# Patient Record
Sex: Female | Born: 1995 | Race: White | Hispanic: No | Marital: Single | Smoking: Current every day smoker
Health system: Southern US, Community
[De-identification: ages and names within clinical notes are randomized; demographics above are authoritative.]

## PROBLEM LIST (undated history)

## (undated) ENCOUNTER — Inpatient Hospital Stay (HOSPITAL_COMMUNITY): Payer: Self-pay

## (undated) DIAGNOSIS — D649 Anemia, unspecified: Secondary | ICD-10-CM

## (undated) DIAGNOSIS — N028 Recurrent and persistent hematuria with other morphologic changes: Secondary | ICD-10-CM

## (undated) DIAGNOSIS — N02B9 Other recurrent and persistent immunoglobulin A nephropathy: Secondary | ICD-10-CM

## (undated) DIAGNOSIS — N83209 Unspecified ovarian cyst, unspecified side: Secondary | ICD-10-CM

## (undated) DIAGNOSIS — L720 Epidermal cyst: Secondary | ICD-10-CM

## (undated) DIAGNOSIS — N289 Disorder of kidney and ureter, unspecified: Secondary | ICD-10-CM

## (undated) DIAGNOSIS — I10 Essential (primary) hypertension: Secondary | ICD-10-CM

## (undated) DIAGNOSIS — J45909 Unspecified asthma, uncomplicated: Secondary | ICD-10-CM

## (undated) DIAGNOSIS — B999 Unspecified infectious disease: Secondary | ICD-10-CM

## (undated) HISTORY — PX: APPENDECTOMY: SHX54

## (undated) HISTORY — DX: Epidermal cyst: L72.0

## (undated) HISTORY — DX: Unspecified asthma, uncomplicated: J45.909

## (undated) HISTORY — DX: Anemia, unspecified: D64.9

## (undated) HISTORY — PX: TONSILLECTOMY: SUR1361

## (undated) HISTORY — PX: WISDOM TOOTH EXTRACTION: SHX21

---
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
## 2015-01-05 ENCOUNTER — Encounter: Payer: Self-pay | Admitting: *Deleted

## 2015-01-05 ENCOUNTER — Emergency Department
Admission: EM | Admit: 2015-01-05 | Discharge: 2015-01-05 | Disposition: A | Discharge status: Home / Self Care | Payer: Medicaid Other | Attending: Emergency Medicine | Admitting: Emergency Medicine

## 2015-01-05 DIAGNOSIS — I10 Essential (primary) hypertension: Secondary | ICD-10-CM | POA: Insufficient documentation

## 2015-01-05 DIAGNOSIS — Z792 Long term (current) use of antibiotics: Secondary | ICD-10-CM | POA: Diagnosis not present

## 2015-01-05 DIAGNOSIS — Z79899 Other long term (current) drug therapy: Secondary | ICD-10-CM | POA: Diagnosis not present

## 2015-01-05 DIAGNOSIS — Z720 Tobacco use: Secondary | ICD-10-CM | POA: Insufficient documentation

## 2015-01-05 DIAGNOSIS — R102 Pelvic and perineal pain: Secondary | ICD-10-CM | POA: Diagnosis present

## 2015-01-05 DIAGNOSIS — Z3202 Encounter for pregnancy test, result negative: Secondary | ICD-10-CM | POA: Diagnosis not present

## 2015-01-05 DIAGNOSIS — N8320 Unspecified ovarian cysts: Secondary | ICD-10-CM | POA: Insufficient documentation

## 2015-01-05 DIAGNOSIS — N83201 Unspecified ovarian cyst, right side: Secondary | ICD-10-CM

## 2015-01-05 LAB — COMPREHENSIVE METABOLIC PANEL
ALK PHOS: 77 U/L (ref 38–126)
ALT: 15 U/L (ref 14–54)
AST: 19 U/L (ref 15–41)
Albumin: 3.5 g/dL (ref 3.5–5.0)
Anion gap: 7 (ref 5–15)
BUN: 9 mg/dL (ref 6–20)
CALCIUM: 8.6 mg/dL — AB (ref 8.9–10.3)
CO2: 25 mmol/L (ref 22–32)
Chloride: 106 mmol/L (ref 101–111)
Creatinine, Ser: 0.61 mg/dL (ref 0.44–1.00)
Glucose, Bld: 107 mg/dL — ABNORMAL HIGH (ref 65–99)
Potassium: 3.3 mmol/L — ABNORMAL LOW (ref 3.5–5.1)
Sodium: 138 mmol/L (ref 135–145)
Total Bilirubin: 0.3 mg/dL (ref 0.3–1.2)
Total Protein: 6.6 g/dL (ref 6.5–8.1)

## 2015-01-05 LAB — URINALYSIS COMPLETE WITH MICROSCOPIC (ARMC ONLY)
BILIRUBIN URINE: NEGATIVE
Bacteria, UA: NONE SEEN
GLUCOSE, UA: NEGATIVE mg/dL
Hgb urine dipstick: NEGATIVE
KETONES UR: NEGATIVE mg/dL
Leukocytes, UA: NEGATIVE
Nitrite: NEGATIVE
PH: 7 (ref 5.0–8.0)
Protein, ur: >500 mg/dL — AB
Specific Gravity, Urine: 1.015 (ref 1.005–1.030)

## 2015-01-05 LAB — CBC
HCT: 39 % (ref 35.0–47.0)
Hemoglobin: 12.6 g/dL (ref 12.0–16.0)
MCH: 24.2 pg — AB (ref 26.0–34.0)
MCHC: 32.4 g/dL (ref 32.0–36.0)
MCV: 74.7 fL — AB (ref 80.0–100.0)
PLATELETS: 290 10*3/uL (ref 150–440)
RBC: 5.22 MIL/uL — AB (ref 3.80–5.20)
RDW: 16 % — ABNORMAL HIGH (ref 11.5–14.5)
WBC: 12.3 10*3/uL — ABNORMAL HIGH (ref 3.6–11.0)

## 2015-01-05 LAB — LIPASE, BLOOD: Lipase: 20 U/L — ABNORMAL LOW (ref 22–51)

## 2015-01-05 LAB — POCT PREGNANCY, URINE: Preg Test, Ur: NEGATIVE

## 2015-01-05 NOTE — ED Provider Notes (Signed)
[name] Emergency Department Provider Note  ____________________________________________  Time seen: 1917  I have reviewed the triage vital signs and the nursing notes.   HISTORY  Chief Complaint Abdominal Pain     HPI Carly Taylor is a 19 y.o. female who presents to the emergency from today with pain in her right lower abdomen and pelvis. She reports this is very similar to the pain she had on August 7 when she was seen here before. The pain has waxed and waned over the past few weeks. This morning she felt comfortable. This started to worsen this afternoon. She took a 200 mg tablet of ibuprofen and lay down. She was able to rest and the pain went away. After she woke the pain began to come back and she decided to come to the emergency department.  When she was seen here on August 7, she was told she had ovarian cyst. An ultrasound had been performed. She is referred to gynecology. She has not made a follow-up appointment with them.    Past Medical History  Diagnosis Date  . IgA nephropathy   . Hypertension   . Kidney disease     There are no active problems to display for this patient.   Past Surgical History  Procedure Laterality Date  . Appendectomy      Current Outpatient Rx  Name  Route  Sig  Dispense  Refill  . cephALEXin (KEFLEX) 500 MG capsule   Oral   Take 1 capsule (500 mg total) by mouth 2 (two) times daily.   20 capsule   0   . HYDROcodone-acetaminophen (NORCO) 5-325 MG per tablet   Oral   Take 1 tablet by mouth every 4 (four) hours as needed for moderate pain.   12 tablet   0   . lisinopril (PRINIVIL,ZESTRIL) 20 MG tablet   Oral   Take 20 mg by mouth daily.         Marland Kitchen oxyCODONE-acetaminophen (ROXICET) 5-325 MG per tablet   Oral   Take 1 tablet by mouth every 6 (six) hours as needed.   12 tablet   0     Allergies Nsaids  No family history on file.  Social History Social History  Substance Use Topics   . Smoking status: Current Every Day Smoker  . Smokeless tobacco: Never Used  . Alcohol Use: No    Review of Systems  Constitutional: Negative for fever. ENT: Negative for sore throat. Cardiovascular: Negative for chest pain. Respiratory: Negative for shortness of breath. Gastrointestinal: Negative for abdominal pain, vomiting and diarrhea. Genitourinary: Positive for pelvic pain, similar to earlier this month. She history of present illness Musculoskeletal: No myalgias or injuries. Skin: Negative for rash. Neurological: Negative for headaches   10-point ROS otherwise negative.  ____________________________________________   PHYSICAL EXAM:  VITAL SIGNS: ED Triage Vitals  Enc Vitals Group     BP 01/05/15 1739 155/105 mmHg     Pulse Rate 01/05/15 1739 87     Resp 01/05/15 1739 16     Temp 01/05/15 1739 98.3 F (36.8 C)     Temp Source 01/05/15 1739 Oral     SpO2 01/05/15 1739 99 %     Weight 01/05/15 1739 198 lb (89.812 kg)     Height 01/05/15 1739 5\' 4"  (1.626 m)     Head Cir --      Peak Flow --      Pain Score 01/05/15 1740 7  Pain Loc --      Pain Edu? --      Excl. in [place]? --     Constitutional:  Alert and oriented. Well appearing and in no distress. ENT   Head: Normocephalic and atraumatic.   Nose: No congestion/rhinnorhea.   Mouth/Throat: Mucous membranes are moist. Cardiovascular: Normal rate, regular rhythm, no murmur noted Respiratory:  Normal respiratory effort, no tachypnea.    Breath sounds are clear and equal bilaterally.  Gastrointestinal: Soft with minimal tenderness in the right pelvic area. No distention.  Back: No muscle spasm, no tenderness, no CVA tenderness. Musculoskeletal: No deformity noted. Nontender with normal range of motion in all extremities.  No noted edema. Neurologic:  Normal speech and language. No gross focal neurologic deficits are appreciated.  Skin:  Skin is warm, dry. No rash noted. Psychiatric: Mood and  affect are normal. Speech and behavior are normal.  ____________________________________________    LABS (pertinent positives/negatives)  Labs Reviewed  LIPASE, BLOOD - Abnormal; Notable for the following:    Lipase 20 (*)    All other components within normal limits  COMPREHENSIVE METABOLIC PANEL - Abnormal; Notable for the following:    Potassium 3.3 (*)    Glucose, Bld 107 (*)    Calcium 8.6 (*)    All other components within normal limits  CBC - Abnormal; Notable for the following:    WBC 12.3 (*)    RBC 5.22 (*)    MCV 74.7 (*)    MCH 24.2 (*)    RDW 16.0 (*)    All other components within normal limits  URINALYSIS COMPLETEWITH MICROSCOPIC ([facility] ONLY) - Abnormal; Notable for the following:    Color, Urine YELLOW (*)    APPearance CLEAR (*)    Protein, ur >500 (*)    Squamous Epithelial / LPF 0-5 (*)    All other components within normal limits  POC URINE PREG, ED  POCT PREGNANCY, URINE     ____________________________________________    RADIOLOGY  Ultrasound from August 7 reviewed.  ____________________________________________    INITIAL IMPRESSION / ASSESSMENT AND PLAN / ED COURSE  Pertinent labs & imaging results that were available during my care of the patient were reviewed by me and considered in my medical decision making (see chart for details).   19 year old female with right pelvic pain similar to what she had earlier this month when she was diagnosed with ovarian cyst. That cyst had a daughter says within it as well. This pain appears to be fairly easily controlled, as she took 200 mg of ibuprofen and lay down and her pain went away. She currently is comfortable and in no acute distress.  We have reviewed her lab results with her as well as the ultrasound from August 7. I counseled her to follow-up with gynecology again. She reports that she will do this.  I do not believe any stronger pain medications are indicated, as her pain is fairly well  controlled with the use of ibuprofen and lying down.  ____________________________________________   FINAL CLINICAL IMPRESSION(S) / ED DIAGNOSES  Final diagnoses:  Pelvic pain in female  Right ovarian cyst       Ahmed Prima, MD 01/05/15 [phone]

## 2015-01-05 NOTE — ED Notes (Signed)
Pt reports right sided abdominal pain, back pain, nausea, and vaginal spotting. Pt also has had frequent urination. She states she was here for the same, dx with ovarian cyst, feels similar.

## 2015-01-05 NOTE — Discharge Instructions (Signed)
Your blood test is okay. Your urine was normal. Urine pregnancy test was negative. We reviewed the ultrasound you had on August 7. Your pain has improved. He may continue to take ibuprofen, but she may need a higher dose. Take 600 mg 3 times a day if needed. Follow-up with one of the doctors at [place]. Return to the emergency department if you have worsening pain or if you have other urgent concerns.  Ovarian Cyst An ovarian cyst is a sac filled with fluid or blood. This sac is attached to the ovary. Some cysts go away on their own. Other cysts need treatment.  HOME CARE   Only take medicine as told by your doctor.  Follow up with your doctor as told.  Get regular pelvic exams and Pap tests. GET HELP IF:  Your periods are late, not regular, or painful.  You stop having periods.  Your belly (abdominal) or pelvic pain does not go away.  Your belly becomes large or puffy (swollen).  You have a hard time peeing (totally emptying your bladder).  You have pressure on your bladder.  You have pain during sex.  You feel fullness, pressure, or discomfort in your belly.  You lose weight for no reason.  You feel sick most of the time.  You have a hard time pooping (constipation).  You do not feel like eating.  You develop pimples (acne).  You have an increase in hair on your body and face.  You are gaining weight for no reason.  You think you are pregnant. GET HELP RIGHT AWAY IF:   Your belly pain gets worse.  You feel sick to your stomach (nauseous), and you throw up (vomit).  You have a fever that comes on fast.  You have belly pain while pooping (bowel movement).  Your periods are heavier than usual. MAKE SURE YOU:   Understand these instructions.  Will watch your condition.  Will get help right away if you are not doing well or get worse. Document Released: 10/09/2007 Document Revised: 02/10/2013 Document Reviewed: 12/28/2012 [facility] Patient  Information 2015 [place], [place]. This information is not intended to replace advice given to you by your health care provider. Make sure you discuss any questions you have with your health care provider.

## 2015-01-21 ENCOUNTER — Encounter: Payer: Self-pay | Admitting: Emergency Medicine

## 2015-01-21 ENCOUNTER — Emergency Department
Admission: EM | Admit: 2015-01-21 | Discharge: 2015-01-21 | Discharge status: Against Medical Advice | Payer: Medicaid Other | Attending: Emergency Medicine | Admitting: Emergency Medicine

## 2015-01-21 DIAGNOSIS — Z720 Tobacco use: Secondary | ICD-10-CM | POA: Insufficient documentation

## 2015-01-21 DIAGNOSIS — R51 Headache: Secondary | ICD-10-CM | POA: Insufficient documentation

## 2015-01-21 DIAGNOSIS — I10 Essential (primary) hypertension: Secondary | ICD-10-CM | POA: Diagnosis not present

## 2015-01-21 DIAGNOSIS — R110 Nausea: Secondary | ICD-10-CM | POA: Diagnosis not present

## 2015-01-21 LAB — COMPREHENSIVE METABOLIC PANEL
ALBUMIN: 3.4 g/dL — AB (ref 3.5–5.0)
ALT: 13 U/L — ABNORMAL LOW (ref 14–54)
ANION GAP: 5 (ref 5–15)
AST: 19 U/L (ref 15–41)
Alkaline Phosphatase: 82 U/L (ref 38–126)
BUN: 8 mg/dL (ref 6–20)
CALCIUM: 9 mg/dL (ref 8.9–10.3)
CO2: 26 mmol/L (ref 22–32)
Chloride: 110 mmol/L (ref 101–111)
Creatinine, Ser: 0.64 mg/dL (ref 0.44–1.00)
GFR calc non Af Amer: >60 mL/min (ref >60)
GLUCOSE: 105 mg/dL — AB (ref 65–99)
POTASSIUM: 3.2 mmol/L — AB (ref 3.5–5.1)
SODIUM: 141 mmol/L (ref 135–145)
Total Bilirubin: 0.5 mg/dL (ref 0.3–1.2)
Total Protein: 6.5 g/dL (ref 6.5–8.1)

## 2015-01-21 LAB — CBC WITH DIFFERENTIAL/PLATELET
BASOS PCT: 1 %
Basophils Absolute: 0.1 10*3/uL (ref 0–0.1)
EOS ABS: 0.2 10*3/uL (ref 0–0.7)
EOS PCT: 2 %
HCT: 37.8 % (ref 35.0–47.0)
Hemoglobin: 12.3 g/dL (ref 12.0–16.0)
LYMPHS ABS: 2.1 10*3/uL (ref 1.0–3.6)
Lymphocytes Relative: 20 %
MCH: 24.6 pg — AB (ref 26.0–34.0)
MCHC: 32.6 g/dL (ref 32.0–36.0)
MCV: 75.4 fL — ABNORMAL LOW (ref 80.0–100.0)
MONO ABS: 0.4 10*3/uL (ref 0.2–0.9)
MONOS PCT: 4 %
Neutro Abs: 7.9 10*3/uL — ABNORMAL HIGH (ref 1.4–6.5)
Neutrophils Relative %: 73 %
PLATELETS: 296 10*3/uL (ref 150–440)
RBC: 5.02 MIL/uL (ref 3.80–5.20)
RDW: 16.9 % — AB (ref 11.5–14.5)
WBC: 10.7 10*3/uL (ref 3.6–11.0)

## 2015-01-21 LAB — URINALYSIS COMPLETE WITH MICROSCOPIC (ARMC ONLY)
BILIRUBIN URINE: NEGATIVE
GLUCOSE, UA: NEGATIVE mg/dL
Ketones, ur: NEGATIVE mg/dL
Nitrite: NEGATIVE
PH: 6 (ref 5.0–8.0)
Protein, ur: >500 mg/dL — AB
SPECIFIC GRAVITY, URINE: 1.014 (ref 1.005–1.030)

## 2015-01-21 LAB — POCT PREGNANCY, URINE: Preg Test, Ur: NEGATIVE

## 2015-01-21 NOTE — ED Notes (Signed)
Denies head injury or fevers

## 2015-01-21 NOTE — ED Notes (Signed)
Called x 3 for room, not in waiting room.

## 2015-06-15 ENCOUNTER — Emergency Department
Admission: EM | Admit: 2015-06-15 | Discharge: 2015-06-15 | Disposition: A | Discharge status: Home / Self Care | Payer: Medicaid Other | Attending: Emergency Medicine | Admitting: Emergency Medicine

## 2015-06-15 ENCOUNTER — Encounter: Payer: Self-pay | Admitting: *Deleted

## 2015-06-15 ENCOUNTER — Emergency Department: Payer: Medicaid Other

## 2015-06-15 DIAGNOSIS — R079 Chest pain, unspecified: Secondary | ICD-10-CM

## 2015-06-15 DIAGNOSIS — I10 Essential (primary) hypertension: Secondary | ICD-10-CM | POA: Insufficient documentation

## 2015-06-15 DIAGNOSIS — Z3202 Encounter for pregnancy test, result negative: Secondary | ICD-10-CM | POA: Insufficient documentation

## 2015-06-15 DIAGNOSIS — F17210 Nicotine dependence, cigarettes, uncomplicated: Secondary | ICD-10-CM | POA: Insufficient documentation

## 2015-06-15 LAB — CBC
HCT: 47.5 % — ABNORMAL HIGH (ref 35.0–47.0)
Hemoglobin: 15.9 g/dL (ref 12.0–16.0)
MCH: 26.9 pg (ref 26.0–34.0)
MCHC: 33.4 g/dL (ref 32.0–36.0)
MCV: 80.6 fL (ref 80.0–100.0)
Platelets: 336 10*3/uL (ref 150–440)
RBC: 5.9 MIL/uL — ABNORMAL HIGH (ref 3.80–5.20)
RDW: 15.7 % — ABNORMAL HIGH (ref 11.5–14.5)
WBC: 15.4 10*3/uL — ABNORMAL HIGH (ref 3.6–11.0)

## 2015-06-15 LAB — URINALYSIS COMPLETE WITH MICROSCOPIC (ARMC ONLY)
Bacteria, UA: NONE SEEN
Bilirubin Urine: NEGATIVE
GLUCOSE, UA: NEGATIVE mg/dL
Ketones, ur: NEGATIVE mg/dL
Leukocytes, UA: NEGATIVE
NITRITE: NEGATIVE
Protein, ur: >500 mg/dL — AB
SPECIFIC GRAVITY, URINE: 1.014 (ref 1.005–1.030)
pH: 5 (ref 5.0–8.0)

## 2015-06-15 LAB — BASIC METABOLIC PANEL
Anion gap: 10 (ref 5–15)
BUN: 9 mg/dL (ref 6–20)
CALCIUM: 9.4 mg/dL (ref 8.9–10.3)
CO2: 23 mmol/L (ref 22–32)
CREATININE: 0.83 mg/dL (ref 0.44–1.00)
Chloride: 106 mmol/L (ref 101–111)
GFR calc Af Amer: >60 mL/min (ref >60)
GLUCOSE: 107 mg/dL — AB (ref 65–99)
Potassium: 3.5 mmol/L (ref 3.5–5.1)
Sodium: 139 mmol/L (ref 135–145)

## 2015-06-15 LAB — POCT PREGNANCY, URINE: PREG TEST UR: NEGATIVE

## 2015-06-15 NOTE — ED Provider Notes (Signed)
[facility] Emergency Department Provider Note     Time seen: ----------------------------------------- 6:21 PM on 06/15/2015 -----------------------------------------    I have reviewed the triage vital signs and the nursing notes.   HISTORY  Chief Complaint Chest Pain    HPI Carly Taylor is a 20 y.o. female who presents to ER for left-sided chest pain for last 2 days. 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 Patient states earlier her blood pressure was elevated but currently it is not. She denies any current complaints.   Past Medical History  Diagnosis Date  . IgA nephropathy   . Hypertension   . Kidney disease   . IgA nephropathy     There are no active problems to display for this patient.   Past Surgical History  Procedure Laterality Date  . Appendectomy      Allergies Nsaids  Social History Social History  Substance Use Topics  . Smoking status: Current Every Day Smoker -- 0.50 packs/day    Types: Cigarettes  . Smokeless tobacco: Never Used  . Alcohol Use: No    Review of Systems Constitutional: Negative for fever. Eyes: Negative for visual changes. ENT: Negative for sore throat. Cardiovascular: Positive for chest pain  Respiratory: Negative for shortness of breath. Gastrointestinal: Negative for abdominal pain, vomiting and diarrhea. Genitourinary: Negative for dysuria. Musculoskeletal: Negative for back pain. Skin: Negative for rash. Neurological: Negative for headaches, focal weakness or numbness.  10-point ROS otherwise negative.  ____________________________________________   PHYSICAL EXAM:  VITAL SIGNS: ED Triage Vitals  Enc Vitals Group     BP 06/15/15 1815 128/79 mmHg     Pulse Rate 06/15/15 1815 113    Resp 06/15/15 1815 18     Temp 06/15/15 1815 98.7 F (37.1 C)     Temp Source 06/15/15 1815 Oral     SpO2 06/15/15 1815 98 %     Weight 06/15/15 1815 201 lb (91.173 kg)     Height 06/15/15 1815 5\' 4"  (1.626 m)     Head Cir --      Peak Flow --      Pain Score 06/15/15 1811 0     Pain Loc --      Pain Edu? --      Excl. in [place]? --     Constitutional: Alert and oriented. Well appearing and in no distress. Eyes: Conjunctivae are normal. PERRL. Normal extraocular movements. ENT   Head: Normocephalic and atraumatic.   Nose: No congestion/rhinnorhea.   Mouth/Throat: Mucous membranes are moist.   Neck: No stridor. Cardiovascular: Rapid rate, regular rhythm. Normal and symmetric distal pulses are present in all extremities. No murmurs, rubs, or gallops. Respiratory: Normal respiratory effort without tachypnea nor retractions. Breath sounds are clear and equal bilaterally. No wheezes/rales/rhonchi. Gastrointestinal: Soft and nontender. No distention. No abdominal bruits.  Musculoskeletal: Nontender with normal range of motion in all extremities. No joint effusions.  No lower extremity tenderness nor edema. Neurologic:  Normal speech and language. No gross focal neurologic deficits are appreciated. Speech is normal. No gait instability. Skin:  Skin is warm, dry and intact. No rash noted. Psychiatric: Mood and affect are normal. Speech and behavior are normal. Patient exhibits appropriate insight and judgment. ____________________________________________  EKG: Interpreted by me. Sinus tachycardia with a rate  of 107 bpm, normal PR interval and QRS, normal QT interval. Normal axis. T waves.  ____________________________________________  ED COURSE:  Pertinent labs & imaging results that were available during my care of the patient were reviewed by me and considered in my medical decision making (see chart for details). Patient is in no acute distress, will check basic labs and  reevaluate. ____________________________________________    LABS (pertinent positives/negatives)  Labs Reviewed  BASIC METABOLIC PANEL - Abnormal; Notable for the following:    Glucose, Bld 107 (*)    All other components within normal limits  CBC - Abnormal; Notable for the following:    WBC 15.4 (*)    RBC 5.90 (*)    HCT 47.5 (*)    RDW 15.7 (*)    All other components within normal limits  URINALYSIS COMPLETEWITH MICROSCOPIC ([facility] ONLY) - Abnormal; Notable for the following:    Color, Urine YELLOW (*)    APPearance HAZY (*)    Hgb urine dipstick 1+ (*)    Protein, ur >500 (*)    Squamous Epithelial / LPF 0-5 (*)    All other components within normal limits  POC URINE PREG, ED  POCT PREGNANCY, URINE    RADIOLOGY  Chest x-ray IMPRESSION: No active cardiopulmonary disease. ____________________________________________  FINAL ASSESSMENT AND PLAN  Chest pain  Plan: Patient with labs and imaging as dictated above. Nonspecific chest pain, does not appear to be any acute process. She does have chronic proteinuria. Is advised to follow-up with her doctor for reevaluation.   Earleen Newport, MD   Earleen Newport, MD 06/15/15 [phone]

## 2015-06-15 NOTE — ED Notes (Signed)
Pt reports left sided chest pain x2 days; reports she went to donate plasma today and they told her she was tachycardic (130).

## 2015-06-15 NOTE — Discharge Instructions (Signed)

## 2015-09-20 ENCOUNTER — Emergency Department
Admission: EM | Admit: 2015-09-20 | Discharge: 2015-09-20 | Disposition: A | Discharge status: Home / Self Care | Payer: Self-pay | Attending: Emergency Medicine | Admitting: Emergency Medicine

## 2015-09-20 ENCOUNTER — Encounter: Payer: Self-pay | Admitting: Emergency Medicine

## 2015-09-20 DIAGNOSIS — R809 Proteinuria, unspecified: Secondary | ICD-10-CM | POA: Insufficient documentation

## 2015-09-20 DIAGNOSIS — R109 Unspecified abdominal pain: Secondary | ICD-10-CM

## 2015-09-20 DIAGNOSIS — R197 Diarrhea, unspecified: Secondary | ICD-10-CM | POA: Insufficient documentation

## 2015-09-20 DIAGNOSIS — I10 Essential (primary) hypertension: Secondary | ICD-10-CM | POA: Insufficient documentation

## 2015-09-20 DIAGNOSIS — Z79899 Other long term (current) drug therapy: Secondary | ICD-10-CM | POA: Insufficient documentation

## 2015-09-20 DIAGNOSIS — R112 Nausea with vomiting, unspecified: Secondary | ICD-10-CM | POA: Insufficient documentation

## 2015-09-20 DIAGNOSIS — F17210 Nicotine dependence, cigarettes, uncomplicated: Secondary | ICD-10-CM | POA: Insufficient documentation

## 2015-09-20 LAB — URINALYSIS COMPLETE WITH MICROSCOPIC (ARMC ONLY)
BILIRUBIN URINE: NEGATIVE
Glucose, UA: NEGATIVE mg/dL
Ketones, ur: NEGATIVE mg/dL
LEUKOCYTES UA: NEGATIVE
Nitrite: NEGATIVE
PH: 5 (ref 5.0–8.0)
Specific Gravity, Urine: 1.014 (ref 1.005–1.030)

## 2015-09-20 LAB — CBC
HEMATOCRIT: 41.8 % (ref 35.0–47.0)
Hemoglobin: 14.4 g/dL (ref 12.0–16.0)
MCH: 27.5 pg (ref 26.0–34.0)
MCHC: 34.5 g/dL (ref 32.0–36.0)
MCV: 79.7 fL — AB (ref 80.0–100.0)
PLATELETS: 324 10*3/uL (ref 150–440)
RBC: 5.25 MIL/uL — AB (ref 3.80–5.20)
RDW: 15.3 % — ABNORMAL HIGH (ref 11.5–14.5)
WBC: 13 10*3/uL — ABNORMAL HIGH (ref 3.6–11.0)

## 2015-09-20 LAB — COMPREHENSIVE METABOLIC PANEL
ALT: 14 U/L (ref 14–54)
AST: 19 U/L (ref 15–41)
Albumin: 3.5 g/dL (ref 3.5–5.0)
Alkaline Phosphatase: 90 U/L (ref 38–126)
Anion gap: 7 (ref 5–15)
BUN: 10 mg/dL (ref 6–20)
CHLORIDE: 109 mmol/L (ref 101–111)
CO2: 23 mmol/L (ref 22–32)
CREATININE: 0.76 mg/dL (ref 0.44–1.00)
Calcium: 9.3 mg/dL (ref 8.9–10.3)
GFR calc non Af Amer: >60 mL/min (ref >60)
Glucose, Bld: 109 mg/dL — ABNORMAL HIGH (ref 65–99)
POTASSIUM: 3.9 mmol/L (ref 3.5–5.1)
SODIUM: 139 mmol/L (ref 135–145)
Total Bilirubin: 0.3 mg/dL (ref 0.3–1.2)
Total Protein: 6.4 g/dL — ABNORMAL LOW (ref 6.5–8.1)

## 2015-09-20 LAB — LIPASE, BLOOD: LIPASE: 21 U/L (ref 11–51)

## 2015-09-20 MED ORDER — ONDANSETRON 4 MG PO TBDP: 4.0000 mg | ORAL_TABLET | Freq: Three times a day (TID) | ORAL | Status: DC | PRN

## 2015-09-20 MED ORDER — LOPERAMIDE HCL 2 MG PO TABS: 2.0000 mg | ORAL_TABLET | Freq: Four times a day (QID) | ORAL | Status: DC | PRN

## 2015-09-20 MED ORDER — DIPHENOXYLATE-ATROPINE 2.5-0.025 MG PO TABS
1.0000 | ORAL_TABLET | Freq: Once | ORAL | Status: AC
  Administered 2015-09-20: 1 via ORAL
  Filled 2015-09-20: qty 1

## 2015-09-20 NOTE — ED Provider Notes (Signed)
[facility] Emergency Department Provider Note  ____________________________________________  Time seen: Approximately 5:52 PM  I have reviewed the triage vital signs and the nursing notes.   HISTORY  Chief Complaint Abdominal Pain    HPI Carly Taylor is a 20 y.o. female with known IgA nephropathy and associated hypertension and proteinuria, who is a Hotel manager, presenting with nausea vomiting and diarrhea. The patient reports that for the last several weeks she's been having multiple daily episodes of watery nonbloody stool. Last week she had 1 episode of vomiting, and then had another episode today. She estimates that today alone she had 8 episodes of watery stool. She denies any camping, travel outside the [place] states, recent antibiotic use, or known food allergies. She's not had fever, chills, lightheadedness or syncope. She is not having any urinary symptoms. She has not tried any anti-diarrheal medication due to concerns about her kidney dysfunction. She occasionally has associated lower abdominal cramping which occurs just prior to diarrhea or during the time she is having a bowel movement, but never occurs outside of these times.LMP is unknown and she has Norplant   Past Medical History  Diagnosis Date  . IgA nephropathy   . Hypertension   . Kidney disease   . IgA nephropathy     There are no active problems to display for this patient.   Past Surgical History  Procedure Laterality Date  . Appendectomy      Current Outpatient Rx  Name  Route  Sig  Dispense  Refill  . cephALEXin (KEFLEX) 500 MG capsule   Oral   Take 1 capsule (500 mg total) by mouth 2 (two) times daily.   20 capsule   0   . HYDROcodone-acetaminophen (NORCO) 5-325 MG per tablet   Oral   Take 1 tablet by mouth every 4 (four) hours as needed for moderate pain.   12 tablet   0   . lisinopril (PRINIVIL,ZESTRIL) 20 MG tablet   Oral   Take 20 mg by mouth daily.          Marland Kitchen loperamide (IMODIUM A-D) 2 MG tablet   Oral   Take 1 tablet (2 mg total) by mouth 4 (four) times daily as needed for diarrhea or loose stools.   12 tablet   0   . ondansetron (ZOFRAN ODT) 4 MG disintegrating tablet   Oral   Take 1 tablet (4 mg total) by mouth every 8 (eight) hours as needed for nausea or vomiting.   10 tablet   0   . oxyCODONE-acetaminophen (ROXICET) 5-325 MG per tablet   Oral   Take 1 tablet by mouth every 6 (six) hours as needed.   12 tablet   0     Allergies Nsaids  No family history on file.  Social History Social History  Substance Use Topics  . Smoking status: Current Every Day Smoker -- 0.50 packs/day    Types: Cigarettes  . Smokeless tobacco: Never Used  . Alcohol Use: No    Review of Systems Constitutional: No fever/chills.No lightheadedness or syncope. Eyes: No visual changes. ENT: No sore throat. No congestion or rhinorrhea. Cardiovascular: Denies chest pain. Denies palpitations. Respiratory: Denies shortness of breath.  No cough. Gastrointestinal: Positive lower abdominal cramping.  Positive nausea, positive vomiting.  Positive diarrhea.  No constipation. Genitourinary: Negative for dysuria. Musculoskeletal: Negative for back pain. Skin: Negative for rash. Neurological: Negative for headaches. No focal numbness, tingling or weakness.   10-point ROS otherwise negative.  ____________________________________________   PHYSICAL EXAM:  VITAL SIGNS: ED Triage Vitals  Enc Vitals Group     BP 09/20/15 1512 165/112 mmHg     Pulse Rate 09/20/15 1512 94     Resp 09/20/15 1512 20     Temp 09/20/15 1512 98.6 F (37 C)     Temp Source 09/20/15 1512 Oral     SpO2 09/20/15 1512 100 %     Weight 09/20/15 1512 210 lb (95.255 kg)     Height 09/20/15 1512 5\' 4"  (1.626 m)     Head Cir --      Peak Flow --      Pain Score 09/20/15 1513 6     Pain Loc --      Pain Edu? --      Excl. in [place]? --     Constitutional: Alert and  oriented. Well appearing and in no acute distress. Answers questions appropriately. Eyes: Conjunctivae are normal.  EOMI. No scleral icterus. Head: Atraumatic. Nose: No congestion/rhinnorhea. Mouth/Throat: Mucous membranes are moist.  Neck: No stridor.  Supple.   Cardiovascular: Normal rate, regular rhythm. No murmurs, rubs or gallops.  Respiratory: Normal respiratory effort.  No accessory muscle use or retractions. Lungs CTAB.  No wheezes, rales or ronchi. Gastrointestinal: Obese. Soft, nontender and nondistended.  No guarding or rebound.  No peritoneal signs. Musculoskeletal: No LE edema. No ttp in the calves or palpable cords.  Negative Homan's sign. Neurologic:  A&Ox3.  Speech is clear.  Face and smile are symmetric.  EOMI.  Moves all extremities well. Skin:  Skin is warm, dry and intact. No rash noted. No jaundice. Psychiatric: Mood and affect are normal. Speech and behavior are normal.  Normal judgement.  ____________________________________________   LABS (all labs ordered are listed, but only abnormal results are displayed)  Labs Reviewed  COMPREHENSIVE METABOLIC PANEL - Abnormal; Notable for the following:    Glucose, Bld 109 (*)    Total Protein 6.4 (*)    All other components within normal limits  CBC - Abnormal; Notable for the following:    WBC 13.0 (*)    RBC 5.25 (*)    MCV 79.7 (*)    RDW 15.3 (*)    All other components within normal limits  URINALYSIS COMPLETEWITH MICROSCOPIC ([facility] ONLY) - Abnormal; Notable for the following:    Color, Urine YELLOW (*)    APPearance CLEAR (*)    Hgb urine dipstick 1+ (*)    Protein, ur >500 (*)    Bacteria, UA RARE (*)    Squamous Epithelial / LPF 0-5 (*)    All other components within normal limits  LIPASE, BLOOD   ____________________________________________  EKG  Not indicated ____________________________________________  RADIOLOGY  No results  found.  ____________________________________________   PROCEDURES  Procedure(s) performed: None  Critical Care performed: No ____________________________________________   INITIAL IMPRESSION / ASSESSMENT AND PLAN / ED COURSE  Pertinent labs & imaging results that were available during my care of the patient were reviewed by me and considered in my medical decision making (see chart for details).  20 y.o. female with a history of IgA nephropathy presenting with several weeks of daily watery stool and diarrhea with 2 discrete episodes of nausea and vomiting. Overall, the patient is well-appearing. She is hypertensive on exam, but states that this is baseline. She is afebrile. There is no acute findings in her abdomen which would be concerning for acute surgical pathology. 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 She may also have an undiagnosed food sensitivity or food allergy. I will plan to treat her symptomatically, and have her follow-up with her primary care physician. Her labs are reassuring today and imaging is not indicated given her reassuring abdominal examination. She does have a mildly elevated white blood cell count, but this appears to be within the realm of her normal baseline. The patient has proteinuria and is already aware of this.  ____________________________________________  FINAL CLINICAL IMPRESSION(S) / ED DIAGNOSES  Final diagnoses:  Proteinuria  Essential hypertension  Nausea vomiting and diarrhea  Abdominal cramping      NEW MEDICATIONS STARTED DURING THIS VISIT:  New Prescriptions   LOPERAMIDE (IMODIUM A-D) 2 MG TABLET    Take 1 tablet (2 mg total) by mouth 4 (four) times daily as needed for diarrhea or loose stools.   ONDANSETRON (ZOFRAN ODT) 4 MG DISINTEGRATING TABLET    Take 1 tablet (4 mg total) by mouth every 8 (eight) hours as needed for nausea or vomiting.     Eula Listen, MD 09/20/15  [id number]

## 2015-09-20 NOTE — Discharge Instructions (Signed)
If you're able to produce a stool sample, please bring it with you to your follow-up with your primary care physician so that it can be tested. Please drink plenty of fluid to stay well-hydrated. Please take a BRAT diet for the next 3-4 days, then advance your diet as tolerated. You may take Zofran for nausea and loperamide for diarrhea. Please practice frequent and good handwashing to prevent the spread of infection.  Return to the emergency department if you develop lightheadedness or fainting, inability to keep down fluids, fever, abdominal pain, especially if it stays in one area, or any other symptoms concerning to you.

## 2015-09-20 NOTE — ED Notes (Signed)
Pt presents with abd pain for three mth and began vomiting today.

## 2015-09-24 ENCOUNTER — Encounter (HOSPITAL_COMMUNITY): Payer: Self-pay | Admitting: Emergency Medicine

## 2015-09-24 DIAGNOSIS — R197 Diarrhea, unspecified: Secondary | ICD-10-CM | POA: Insufficient documentation

## 2015-09-24 DIAGNOSIS — R319 Hematuria, unspecified: Secondary | ICD-10-CM | POA: Insufficient documentation

## 2015-09-24 DIAGNOSIS — F17210 Nicotine dependence, cigarettes, uncomplicated: Secondary | ICD-10-CM | POA: Insufficient documentation

## 2015-09-24 DIAGNOSIS — R1084 Generalized abdominal pain: Secondary | ICD-10-CM | POA: Insufficient documentation

## 2015-09-24 DIAGNOSIS — R300 Dysuria: Secondary | ICD-10-CM | POA: Insufficient documentation

## 2015-09-24 DIAGNOSIS — I10 Essential (primary) hypertension: Secondary | ICD-10-CM | POA: Insufficient documentation

## 2015-09-24 LAB — COMPREHENSIVE METABOLIC PANEL WITH GFR
ALT: 16 U/L (ref 14–54)
AST: 16 U/L (ref 15–41)
Albumin: 3 g/dL — ABNORMAL LOW (ref 3.5–5.0)
Alkaline Phosphatase: 95 U/L (ref 38–126)
Anion gap: 8 (ref 5–15)
BUN: 15 mg/dL (ref 6–20)
CO2: 23 mmol/L (ref 22–32)
Calcium: 8.4 mg/dL — ABNORMAL LOW (ref 8.9–10.3)
Chloride: 107 mmol/L (ref 101–111)
Creatinine, Ser: 0.71 mg/dL (ref 0.44–1.00)
GFR calc Af Amer: >60 mL/min
GFR calc non Af Amer: >60 mL/min
Glucose, Bld: 111 mg/dL — ABNORMAL HIGH (ref 65–99)
Potassium: 3.4 mmol/L — ABNORMAL LOW (ref 3.5–5.1)
Sodium: 138 mmol/L (ref 135–145)
Total Bilirubin: 0.2 mg/dL — ABNORMAL LOW (ref 0.3–1.2)
Total Protein: 6.3 g/dL — ABNORMAL LOW (ref 6.5–8.1)

## 2015-09-24 LAB — URINALYSIS, ROUTINE W REFLEX MICROSCOPIC
Bilirubin Urine: NEGATIVE
GLUCOSE, UA: NEGATIVE mg/dL
Ketones, ur: NEGATIVE mg/dL
Nitrite: POSITIVE — AB
Protein, ur: >300 mg/dL — AB
SPECIFIC GRAVITY, URINE: 1.022 (ref 1.005–1.030)
pH: 6.5 (ref 5.0–8.0)

## 2015-09-24 LAB — POC URINE PREG, ED: Preg Test, Ur: NEGATIVE

## 2015-09-24 LAB — CBC
HCT: 42.8 % (ref 36.0–46.0)
HEMOGLOBIN: 14.6 g/dL (ref 12.0–15.0)
MCH: 27.5 pg (ref 26.0–34.0)
MCHC: 34.1 g/dL (ref 30.0–36.0)
MCV: 80.6 fL (ref 78.0–100.0)
PLATELETS: 337 10*3/uL (ref 150–400)
RBC: 5.31 MIL/uL — AB (ref 3.87–5.11)
RDW: 14.3 % (ref 11.5–15.5)
WBC: 13.7 10*3/uL — ABNORMAL HIGH (ref 4.0–10.5)

## 2015-09-24 LAB — URINE MICROSCOPIC-ADD ON

## 2015-09-24 LAB — LIPASE, BLOOD: LIPASE: 25 U/L (ref 11–51)

## 2015-09-24 NOTE — ED Notes (Signed)
Pt. reports hematuria , dysuria , generalized abdominal pain and diarrhea onset 2 months ago , denies fever or chills.

## 2015-09-25 ENCOUNTER — Emergency Department (HOSPITAL_COMMUNITY)
Admission: EM | Admit: 2015-09-25 | Discharge: 2015-09-25 | Disposition: A | Discharge status: Home / Self Care | Payer: Medicaid Other | Attending: Emergency Medicine | Admitting: Emergency Medicine

## 2015-09-25 NOTE — ED Notes (Signed)
Pt called for B17 in waiting room, no answer.

## 2015-10-17 ENCOUNTER — Encounter (HOSPITAL_COMMUNITY): Payer: Self-pay

## 2015-10-17 ENCOUNTER — Emergency Department (HOSPITAL_COMMUNITY)
Admission: EM | Admit: 2015-10-17 | Discharge: 2015-10-17 | Disposition: A | Discharge status: Home / Self Care | Payer: Medicaid Other | Attending: Emergency Medicine | Admitting: Emergency Medicine

## 2015-10-17 DIAGNOSIS — Z5321 Procedure and treatment not carried out due to patient leaving prior to being seen by health care provider: Secondary | ICD-10-CM | POA: Insufficient documentation

## 2015-10-17 DIAGNOSIS — R1030 Lower abdominal pain, unspecified: Secondary | ICD-10-CM | POA: Insufficient documentation

## 2015-10-17 LAB — I-STAT BETA HCG BLOOD, ED (MC, WL, AP ONLY): I-stat hCG, quantitative: <5 m[IU]/mL (ref <5)

## 2015-10-17 LAB — CBC
HEMATOCRIT: 41.9 % (ref 36.0–46.0)
HEMOGLOBIN: 14.1 g/dL (ref 12.0–15.0)
MCH: 27.4 pg (ref 26.0–34.0)
MCHC: 33.7 g/dL (ref 30.0–36.0)
MCV: 81.4 fL (ref 78.0–100.0)
Platelets: 298 10*3/uL (ref 150–400)
RBC: 5.15 MIL/uL — ABNORMAL HIGH (ref 3.87–5.11)
RDW: 14.2 % (ref 11.5–15.5)
WBC: 11 10*3/uL — ABNORMAL HIGH (ref 4.0–10.5)

## 2015-10-17 LAB — URINALYSIS, ROUTINE W REFLEX MICROSCOPIC
BILIRUBIN URINE: NEGATIVE
GLUCOSE, UA: NEGATIVE mg/dL
Ketones, ur: NEGATIVE mg/dL
Leukocytes, UA: NEGATIVE
Nitrite: NEGATIVE
PH: 6 (ref 5.0–8.0)
Protein, ur: >300 mg/dL — AB
SPECIFIC GRAVITY, URINE: 1.023 (ref 1.005–1.030)

## 2015-10-17 LAB — COMPREHENSIVE METABOLIC PANEL
ALBUMIN: 3.1 g/dL — AB (ref 3.5–5.0)
ALK PHOS: 85 U/L (ref 38–126)
ALT: 18 U/L (ref 14–54)
ANION GAP: 7 (ref 5–15)
AST: 20 U/L (ref 15–41)
BUN: 8 mg/dL (ref 6–20)
CALCIUM: 9.3 mg/dL (ref 8.9–10.3)
CHLORIDE: 106 mmol/L (ref 101–111)
CO2: 23 mmol/L (ref 22–32)
Creatinine, Ser: 0.95 mg/dL (ref 0.44–1.00)
GFR calc Af Amer: >60 mL/min (ref >60)
GFR calc non Af Amer: >60 mL/min (ref >60)
GLUCOSE: 99 mg/dL (ref 65–99)
Potassium: 3.7 mmol/L (ref 3.5–5.1)
SODIUM: 136 mmol/L (ref 135–145)
Total Bilirubin: 0.3 mg/dL (ref 0.3–1.2)
Total Protein: 6 g/dL — ABNORMAL LOW (ref 6.5–8.1)

## 2015-10-17 LAB — URINE MICROSCOPIC-ADD ON: WBC, UA: NONE SEEN WBC/hpf (ref 0–5)

## 2015-10-17 LAB — LIPASE, BLOOD: Lipase: 21 U/L (ref 11–51)

## 2015-10-17 MED ORDER — OXYCODONE-ACETAMINOPHEN 5-325 MG PO TABS: 1.0000 | ORAL_TABLET | Freq: Once | ORAL | Status: DC

## 2015-10-17 NOTE — ED Notes (Signed)
Pt was noted to be standing outside, this RN went and told patient they were cleaning a room for her, pt stated she would be inside in a minute, unable to locate patient at this time, attempted to call patient with no answer at this time. Will continue to look for her.

## 2015-10-17 NOTE — ED Notes (Signed)
Unable to locate patient x3.

## 2015-10-17 NOTE — ED Notes (Signed)
Patient complains of lower abdominal pain x 3 daya with nausea, history of ovarian cyst and thinks its related to same

## 2015-10-26 ENCOUNTER — Encounter (HOSPITAL_COMMUNITY): Payer: Self-pay | Admitting: Nurse Practitioner

## 2015-10-26 ENCOUNTER — Emergency Department (HOSPITAL_COMMUNITY): Payer: Medicaid Other

## 2015-10-26 ENCOUNTER — Emergency Department (HOSPITAL_COMMUNITY)
Admission: EM | Admit: 2015-10-26 | Discharge: 2015-10-27 | Disposition: A | Discharge status: Home / Self Care | Payer: Medicaid Other | Attending: Emergency Medicine | Admitting: Emergency Medicine

## 2015-10-26 DIAGNOSIS — F17210 Nicotine dependence, cigarettes, uncomplicated: Secondary | ICD-10-CM | POA: Insufficient documentation

## 2015-10-26 DIAGNOSIS — B349 Viral infection, unspecified: Secondary | ICD-10-CM

## 2015-10-26 DIAGNOSIS — I10 Essential (primary) hypertension: Secondary | ICD-10-CM | POA: Insufficient documentation

## 2015-10-26 LAB — COMPREHENSIVE METABOLIC PANEL
ALK PHOS: 83 U/L (ref 38–126)
ALT: 18 U/L (ref 14–54)
ANION GAP: 7 (ref 5–15)
AST: 19 U/L (ref 15–41)
Albumin: 3.4 g/dL — ABNORMAL LOW (ref 3.5–5.0)
BILIRUBIN TOTAL: 0.5 mg/dL (ref 0.3–1.2)
BUN: 9 mg/dL (ref 6–20)
CALCIUM: 8.1 mg/dL — AB (ref 8.9–10.3)
CO2: 23 mmol/L (ref 22–32)
Chloride: 99 mmol/L — ABNORMAL LOW (ref 101–111)
Creatinine, Ser: 0.69 mg/dL (ref 0.44–1.00)
GFR calc non Af Amer: >60 mL/min (ref >60)
Glucose, Bld: 110 mg/dL — ABNORMAL HIGH (ref 65–99)
POTASSIUM: 3.4 mmol/L — AB (ref 3.5–5.1)
Sodium: 129 mmol/L — ABNORMAL LOW (ref 135–145)
TOTAL PROTEIN: 6.8 g/dL (ref 6.5–8.1)

## 2015-10-26 LAB — CBC WITH DIFFERENTIAL/PLATELET
BASOS PCT: 0 %
Basophils Absolute: 0 10*3/uL (ref 0.0–0.1)
EOS ABS: 0.1 10*3/uL (ref 0.0–0.7)
Eosinophils Relative: 0 %
HEMATOCRIT: 43 % (ref 36.0–46.0)
HEMOGLOBIN: 14.8 g/dL (ref 12.0–15.0)
LYMPHS ABS: 1.6 10*3/uL (ref 0.7–4.0)
Lymphocytes Relative: 11 %
MCH: 28.1 pg (ref 26.0–34.0)
MCHC: 34.4 g/dL (ref 30.0–36.0)
MCV: 81.6 fL (ref 78.0–100.0)
MONO ABS: 0.5 10*3/uL (ref 0.1–1.0)
MONOS PCT: 3 %
NEUTROS PCT: 86 %
Neutro Abs: 12.3 10*3/uL — ABNORMAL HIGH (ref 1.7–7.7)
Platelets: 242 10*3/uL (ref 150–400)
RBC: 5.27 MIL/uL — ABNORMAL HIGH (ref 3.87–5.11)
RDW: 14.6 % (ref 11.5–15.5)
WBC: 14.5 10*3/uL — AB (ref 4.0–10.5)

## 2015-10-26 MED ORDER — ACETAMINOPHEN 500 MG PO TABS
1000.0000 mg | ORAL_TABLET | Freq: Once | ORAL | Status: AC | PRN
  Administered 2015-10-26: 1000 mg via ORAL
  Filled 2015-10-26: qty 2

## 2015-10-26 MED ORDER — ONDANSETRON HCL 4 MG/2ML IJ SOLN: 4.0000 mg | Freq: Once | INTRAMUSCULAR | Status: DC

## 2015-10-26 MED ORDER — SODIUM CHLORIDE 0.9 % IV BOLUS (SEPSIS)
1000.0000 mL | Freq: Once | INTRAVENOUS | Status: AC
  Administered 2015-10-27: 1000 mL via INTRAVENOUS

## 2015-10-26 NOTE — ED Notes (Signed)
Pt reports severe cough that has made her vomit and made it hard for her to breath. States she took 3 breathing treatments at home without getting any relief.

## 2015-10-26 NOTE — ED Provider Notes (Signed)
CSN: [id number]     Arrival date & time 10/26/15  1831 History   By signing my name below, I, Emmanuella Mensah, attest that this documentation has been prepared under the direction and in the presence of Rama Mcclintock, PA-C. Electronically Signed: Judithann Sauger, ED Scribe. 10/26/2015. 11:18 PM.    Chief Complaint  Patient presents with  . Cough  . Fever   The history is provided by the patient. No language interpreter was used.   HPI Comments: Carly Taylor is a 20 y.o. femalewith a hx of hypertension and IgA nephropathy who presents to the Emergency Department complaining of gradually worsening persistent productive cough with clear sputum onset yesterday morning. She reports associated chest tightness, rhinorrhea, congestion, one episode of post-tussive vomiting, and subjective fever. She denies a hx of asthma but states that she normally takes breathing treatments when she gets sick because she had RSV as a baby. Denies wheezing or current sensation of chest tightness. Denies purulent nasal discharge or sinus pressure. She denies any sick contacts. No alleviating factors noted. Pt has tried 3 courses of breathing treatments PTA with no relief. No other medications taken PTA. She denies any headache, dizziness, syncope, neck pain, nausea, sore throat, ear pain, dysuria, or diarrhea.    Past Medical History  Diagnosis Date  . IgA nephropathy   . Hypertension   . Kidney disease   . IgA nephropathy    Past Surgical History  Procedure Laterality Date  . Appendectomy     History reviewed. No pertinent family history. Social History  Substance Use Topics  . Smoking status: Current Every Day Smoker -- 0.00 packs/day    Types: Cigarettes  . Smokeless tobacco: Never Used  . Alcohol Use: No   OB History    No data available     Review of Systems  Constitutional: Positive for fever.  HENT: Positive for congestion and rhinorrhea. Negative for ear pain and sore throat.    Respiratory: Positive for cough and chest tightness.   Gastrointestinal: Positive for vomiting. Negative for nausea.  Neurological: Negative for headaches.  All other systems reviewed and are negative.     Allergies  Nsaids  Home Medications   Prior to Admission medications   Medication Sig Start Date End Date Taking? Authorizing Provider  lisinopril-hydrochlorothiazide (PRINZIDE,ZESTORETIC) 20-25 MG tablet Take 1 tablet by mouth daily.   Yes Historical Provider, MD  Multiple Vitamin (MULTIVITAMIN WITH MINERALS) TABS tablet Take 1 tablet by mouth daily.   Yes Historical Provider, MD  cephALEXin (KEFLEX) 500 MG capsule Take 1 capsule (500 mg total) by mouth 2 (two) times daily. Patient not taking: Reported on 10/26/2015 12/03/14   Delman Kitten, MD  HYDROcodone-acetaminophen (NORCO) 5-325 MG per tablet Take 1 tablet by mouth every 4 (four) hours as needed for moderate pain. Patient not taking: Reported on 10/26/2015 10/03/14   Pierce Crane Beers, PA-C  loperamide (IMODIUM A-D) 2 MG tablet Take 1 tablet (2 mg total) by mouth 4 (four) times daily as needed for diarrhea or loose stools. Patient not taking: Reported on 10/26/2015 09/20/15   Eula Listen, MD  ondansetron (ZOFRAN ODT) 4 MG disintegrating tablet Take 1 tablet (4 mg total) by mouth every 8 (eight) hours as needed for nausea or vomiting. Patient not taking: Reported on 10/26/2015 09/20/15   Eula Listen, MD  ondansetron (ZOFRAN) 4 MG tablet Take 1 tablet (4 mg total) by mouth every 6 (six) hours. 10/27/15   Mohannad Olivero, PA-C  oxyCODONE-acetaminophen (ROXICET) 5-325 MG  per tablet Take 1 tablet by mouth every 6 (six) hours as needed. Patient not taking: Reported on 10/26/2015 12/11/14   Loney Hering, MD   BP 139/90 mmHg  Pulse 110  Temp(Src) 98.8 F (37.1 C) (Oral)  Resp 18  SpO2 100% Physical Exam  Constitutional: She appears well-developed and well-nourished. No distress.  Nontoxic appearing  HENT:  Head:  Normocephalic and atraumatic.  Right Ear: Tympanic membrane, external ear and ear canal normal.  Left Ear: Tympanic membrane, external ear and ear canal normal.  Nose: Nose normal. No mucosal edema or rhinorrhea.  Mouth/Throat: Uvula is midline and oropharynx is clear and moist. No oropharyngeal exudate or posterior oropharyngeal erythema.  Eyes: Conjunctivae are normal. Right eye exhibits no discharge. Left eye exhibits no discharge. No scleral icterus.  Neck: Normal range of motion. Neck supple.  Cardiovascular: Normal rate, regular rhythm and normal heart sounds.   Pulmonary/Chest: Effort normal and breath sounds normal. No respiratory distress. She has no wheezes.  No increased work of breathing. Lungs CTAB. No wheezing  Abdominal: Soft. She exhibits no distension. There is no tenderness.  Musculoskeletal: Normal range of motion.  Moves all extremities spontaneously  Lymphadenopathy:    She has no cervical adenopathy.  Neurological: She is alert. Coordination normal.  Skin: Skin is warm and dry.  Psychiatric: She has a normal mood and affect. Her behavior is normal.  Nursing note and vitals reviewed.   ED Course  Procedures (including critical care time) DIAGNOSTIC STUDIES: Oxygen Saturation is 100% on RA, normal by my interpretation.    COORDINATION OF CARE: 11:16 PM- Pt advised of plan for treatment and pt agrees. Pt informed of lab results and x-ray results. She will receive UA for further evaluation. She will also receive IV fluids, Zofran, and Tylenol.    Labs Review Labs Reviewed  CBC WITH DIFFERENTIAL/PLATELET - Abnormal; Notable for the following:    WBC 14.5 (*)    RBC 5.27 (*)    Neutro Abs 12.3 (*)    All other components within normal limits  COMPREHENSIVE METABOLIC PANEL - Abnormal; Notable for the following:    Sodium 129 (*)    Potassium 3.4 (*)    Chloride 99 (*)    Glucose, Bld 110 (*)    Calcium 8.1 (*)    Albumin 3.4 (*)    All other components within  normal limits  URINALYSIS, ROUTINE W REFLEX MICROSCOPIC (NOT AT [facility]) - Abnormal; Notable for the following:    APPearance CLOUDY (*)    Hgb urine dipstick LARGE (*)    Protein, ur >300 (*)    All other components within normal limits  URINE MICROSCOPIC-ADD ON - Abnormal; Notable for the following:    Squamous Epithelial / LPF 6-30 (*)    Bacteria, UA FEW (*)    All other components within normal limits    Imaging Review Dg Chest 2 View  10/26/2015  CLINICAL DATA:  Shortness of breath, chest tightness EXAM: CHEST  2 VIEW COMPARISON:  06/15/2015 FINDINGS: The heart size and mediastinal contours are within normal limits. Both lungs are clear. The visualized skeletal structures are unremarkable. IMPRESSION: No active cardiopulmonary disease. Electronically Signed   By: Kathreen Devoid   On: 10/26/2015 21:13   Josephina Gip, PA-C personally reviewed and evaluated these images and lab results as part of my medical decision-making.   EKG Interpretation None      MDM   Final diagnoses:  Viral illness   20 year old  female presenting with fever, cough, congestion x 1 day. Attempted breathing treatments at home for cough/chest congestion with no improvement. Temperature 102.3 on arrival which decreased to 98.8 after tylenol. Nontoxic appearing. Benign physical exam. No increased work of breathing or wheeze. Leukocytosis of 14.5. Mild hyponatremia and hypochloremia. IVF given. UA negative for infection. CXR negative. Presentation consistent with viral illness. Discussed symptomatic care. Pt feels improved after zofran and IVF. Pt to follow up with PCP as needed. Return precautions given in discharge paperwork and discussed with pt at bedside. Pt stable for discharge   I personally performed the services described in this documentation, which was scribed in my presence. The recorded information has been reviewed and is accurate.   Josephina Gip, PA-C 10/27/15 1406  April Palumbo, MD 10/28/15  0111

## 2015-10-27 LAB — URINE MICROSCOPIC-ADD ON

## 2015-10-27 LAB — URINALYSIS, ROUTINE W REFLEX MICROSCOPIC
BILIRUBIN URINE: NEGATIVE
Glucose, UA: NEGATIVE mg/dL
KETONES UR: NEGATIVE mg/dL
Leukocytes, UA: NEGATIVE
NITRITE: NEGATIVE
PH: 5.5 (ref 5.0–8.0)
Protein, ur: >300 mg/dL — AB
SPECIFIC GRAVITY, URINE: 1.019 (ref 1.005–1.030)

## 2015-10-27 MED ORDER — ONDANSETRON HCL 4 MG PO TABS: 4.0000 mg | ORAL_TABLET | Freq: Four times a day (QID) | ORAL | Status: DC

## 2015-10-27 NOTE — Discharge Instructions (Signed)
Viral Infections °A viral infection can be caused by different types of viruses. Most viral infections are not serious and resolve on their own. However, some infections may cause severe symptoms and may lead to further complications. °SYMPTOMS °Viruses can frequently cause: °· Minor sore throat. °· Aches and pains. °· Headaches. °· Runny nose. °· Different types of rashes. °· Watery eyes. °· Tiredness. °· Cough. °· Loss of appetite. °· Gastrointestinal infections, resulting in nausea, vomiting, and diarrhea. °These symptoms do not respond to antibiotics because the infection is not caused by bacteria. However, you might catch a bacterial infection following the viral infection. This is sometimes called a "superinfection." Symptoms of such a bacterial infection may include: °· Worsening sore throat with pus and difficulty swallowing. °· Swollen neck glands. °· Chills and a high or persistent fever. °· Severe headache. °· Tenderness over the sinuses. °· Persistent overall ill feeling (malaise), muscle aches, and tiredness (fatigue). °· Persistent cough. °· Yellow, green, or brown mucus production with coughing. °HOME CARE INSTRUCTIONS  °· Only take over-the-counter or prescription medicines for pain, discomfort, diarrhea, or fever as directed by your caregiver. °· Drink enough water and fluids to keep your urine clear or pale yellow. Sports drinks can provide valuable electrolytes, sugars, and hydration. °· Get plenty of rest and maintain proper nutrition. Soups and broths with crackers or rice are fine. °SEEK IMMEDIATE MEDICAL CARE IF:  °· You have severe headaches, shortness of breath, chest pain, neck pain, or an unusual rash. °· You have uncontrolled vomiting, diarrhea, or you are unable to keep down fluids. °· You or your child has an oral temperature above 102° F (38.9° C), not controlled by medicine. °· Your baby is older than 3 months with a rectal temperature of 102° F (38.9° C) or higher. °· Your baby is 3  months old or younger with a rectal temperature of 100.4° F (38° C) or higher. °MAKE SURE YOU:  °· Understand these instructions. °· Will watch your condition. °· Will get help right away if you are not doing well or get worse. °  °This information is not intended to replace advice given to you by your health care provider. Make sure you discuss any questions you have with your health care provider. °  °Document Released: 01/30/2005 Document Revised: 07/15/2011 Document Reviewed: 09/28/2014 °Elsevier Interactive Patient Education ©2016 [employer] ° °

## 2015-12-07 ENCOUNTER — Emergency Department (HOSPITAL_COMMUNITY)
Admission: EM | Admit: 2015-12-07 | Discharge: 2015-12-07 | Disposition: A | Discharge status: Home / Self Care | Payer: Medicaid Other | Attending: Emergency Medicine | Admitting: Emergency Medicine

## 2015-12-07 ENCOUNTER — Encounter (HOSPITAL_COMMUNITY): Payer: Self-pay | Admitting: Emergency Medicine

## 2015-12-07 DIAGNOSIS — J069 Acute upper respiratory infection, unspecified: Secondary | ICD-10-CM | POA: Insufficient documentation

## 2015-12-07 DIAGNOSIS — H9203 Otalgia, bilateral: Secondary | ICD-10-CM | POA: Insufficient documentation

## 2015-12-07 DIAGNOSIS — I10 Essential (primary) hypertension: Secondary | ICD-10-CM | POA: Insufficient documentation

## 2015-12-07 DIAGNOSIS — F17210 Nicotine dependence, cigarettes, uncomplicated: Secondary | ICD-10-CM | POA: Insufficient documentation

## 2015-12-07 LAB — RAPID STREP SCREEN (MED CTR MEBANE ONLY): STREPTOCOCCUS, GROUP A SCREEN (DIRECT): NEGATIVE

## 2015-12-07 MED ORDER — DM-GUAIFENESIN ER 30-600 MG PO TB12: 1.0000 | ORAL_TABLET | Freq: Two times a day (BID) | ORAL | 0 refills | Status: DC | PRN

## 2015-12-07 MED ORDER — ACETAMINOPHEN 500 MG PO TABS
500.0000 mg | ORAL_TABLET | ORAL | 0 refills | Status: DC | PRN
Start: 2015-12-07 — End: 2016-03-23

## 2015-12-07 MED ORDER — FLUTICASONE PROPIONATE 50 MCG/ACT NA SUSP: 2.0000 | Freq: Every day | NASAL | 0 refills | Status: DC

## 2015-12-07 NOTE — ED Provider Notes (Signed)
[facility] DEPT Provider Note   CSN: [id number] Arrival date & time: 12/07/15  1148  First Provider Contact:   First MD Initiated Contact with Patient 12/07/15 1235     By signing my name below, I, Soijett Blue, attest that this documentation has been prepared under the direction and in the presence of Clayton Bibles, PA-C Electronically Signed: Soijett Blue, ED Scribe. 12/07/15. 12:48 PM.    History   Chief Complaint Chief Complaint  Patient presents with  . Sore Throat  . Otalgia    HPI Carly Taylor is a 20 y.o. female with a PMHx of HTN, who presents to the Emergency Department complaining of gradually worsening sore throat onset 3 days. Pt denies sick contacts at this time, but notes that she works at a [employer]. She states that she is having associated symptoms of bilateral ear pain, nasal congestion, rhinorrhea, painful swallowing, resolved subjective fever, resolved chills, and mild cough. She states that she has not tried any medications for the relief of her symptoms. She denies SOB, abdominal pain, n/v, urinary symptoms, ear discharge, and any other symptoms.   The history is provided by the patient. No language interpreter was used.    Past Medical History:  Diagnosis Date  . Hypertension   . IgA nephropathy   . IgA nephropathy   . Kidney disease     There are no active problems to display for this patient.   Past Surgical History:  Procedure Laterality Date  . APPENDECTOMY      OB History    No data available       Home Medications    Prior to Admission medications   Medication Sig Start Date End Date Taking? Authorizing Provider  cephALEXin (KEFLEX) 500 MG capsule Take 1 capsule (500 mg total) by mouth 2 (two) times daily. Patient not taking: Reported on 10/26/2015 12/03/14   Delman Kitten, MD  HYDROcodone-acetaminophen (NORCO) 5-325 MG per tablet Take 1 tablet by mouth every 4 (four) hours as needed for moderate pain. Patient not taking:  Reported on 10/26/2015 10/03/14   Pierce Crane Beers, PA-C  lisinopril-hydrochlorothiazide (PRINZIDE,ZESTORETIC) 20-25 MG tablet Take 1 tablet by mouth daily.    Historical Provider, MD  loperamide (IMODIUM A-D) 2 MG tablet Take 1 tablet (2 mg total) by mouth 4 (four) times daily as needed for diarrhea or loose stools. Patient not taking: Reported on 10/26/2015 09/20/15   Eula Listen, MD  Multiple Vitamin (MULTIVITAMIN WITH MINERALS) TABS tablet Take 1 tablet by mouth daily.    Historical Provider, MD  ondansetron (ZOFRAN ODT) 4 MG disintegrating tablet Take 1 tablet (4 mg total) by mouth every 8 (eight) hours as needed for nausea or vomiting. Patient not taking: Reported on 10/26/2015 09/20/15   Eula Listen, MD  ondansetron (ZOFRAN) 4 MG tablet Take 1 tablet (4 mg total) by mouth every 6 (six) hours. 10/27/15   Stevi Barrett, PA-C  oxyCODONE-acetaminophen (ROXICET) 5-325 MG per tablet Take 1 tablet by mouth every 6 (six) hours as needed. Patient not taking: Reported on 10/26/2015 12/11/14   Loney Hering, MD    Family History No family history on file.  Social History Social History  Substance Use Topics  . Smoking status: Current Every Day Smoker    Packs/day: 0.00    Types: Cigarettes  . Smokeless tobacco: Never Used  . Alcohol use No     Allergies   Nsaids   Review of Systems Review of Systems  Constitutional: Positive for  chills (resolved) and fever (resolved subjective).  HENT: Positive for congestion, ear pain (bilateral), rhinorrhea and sore throat. Negative for ear discharge and trouble swallowing.   Respiratory: Positive for cough. Negative for shortness of breath.   Gastrointestinal: Negative for abdominal pain, nausea and vomiting.  Genitourinary: Negative for difficulty urinating, dysuria and hematuria.     Physical Exam Updated Vital Signs BP (!) 169/108 (BP Location: Right Arm)   Pulse 101   Temp 98 F (36.7 C) (Oral)   Resp 18   LMP  (LMP  Unknown) Comment: patient had implant removed 2 days ago.  She hasn't had period yet.    SpO2 100%   Physical Exam  Constitutional: She appears well-developed and well-nourished. No distress.  HENT:  Head: Normocephalic and atraumatic.  Nose: Mucosal edema (bilateral nares) present.  Mouth/Throat: Uvula is midline and mucous membranes are normal. Posterior oropharyngeal erythema present. No oropharyngeal exudate or posterior oropharyngeal edema.  Posterior oropharyngeal with erythema and without edema or exudate.   Eyes: Conjunctivae are normal.  Neck: Neck supple.  Cardiovascular: Regular rhythm and normal heart sounds.  Tachycardia present.   Pulmonary/Chest: Effort normal and breath sounds normal. No respiratory distress. She has no wheezes. She has no rales.  Lymphadenopathy:  Tender anterior cervical lymphnodes.  Neurological: She is alert.  Skin: She is not diaphoretic.  Nursing note and vitals reviewed.    ED Treatments / Results  DIAGNOSTIC STUDIES: Oxygen Saturation is 100% on RA, nl by my interpretation.    COORDINATION OF CARE: 12:45 PM Discussed treatment plan with pt at bedside which includes rapid strep screen and culture and pt agreed to plan.   Labs (all labs ordered are listed, but only abnormal results are displayed) Labs Reviewed  RAPID STREP SCREEN (NOT AT [facility])  CULTURE, GROUP A STREP [facility])     Procedures Procedures (including critical care time)  Medications Ordered in ED Medications - No data to display   Initial Impression / Assessment and Plan / ED Course  I have reviewed the triage vital signs and the nursing notes.  Pertinent labs that were available during my care of the patient were reviewed by me and considered in my medical decision making (see chart for details).  Clinical Course    Afebrile nontoxic patient with sore throat, nasal, ear symptoms.   Pt with negative strep. Diagnosis of viral pharyngitis. No abx indicated at this  time. Discussed that results of strep culture are pending and patient will be informed if positive result and abx will be called in at that time. Discharge with symptomatic tx. No evidence of dehydration. Pt is tolerating secretions. Presentation not concerning for peritonsillar abscess or spread of infection to deep spaces of the throat; patent airway. Specific return precautions discussed. Recommended PCP follow up. Pt appears safe for discharge. Discussed result, findings, treatment, and follow up  with patient.  Pt given return precautions.  Pt verbalizes understanding and agrees with plan.      Final Clinical Impressions(s) / ED Diagnoses   Final diagnoses:  URI (upper respiratory infection)    New Prescriptions Discharge Medication List as of 12/07/2015 12:50 PM    START taking these medications   Details  acetaminophen (TYLENOL) 500 MG tablet Take 1-2 tablets (500-1,000 mg total) by mouth every 4 (four) hours as needed (Maximum of 8 pills (4000mg ) in 24 hours)., Starting Thu 12/07/2015, Print    dextromethorphan-guaiFENesin (MUCINEX DM) 30-600 MG 12hr tablet Take 1 tablet by mouth 2 (two) times daily  as needed for cough (or nasal congestion, ear pressure)., Starting Thu 12/07/2015, Print    fluticasone (FLONASE) 50 MCG/ACT nasal spray Place 2 sprays into both nostrils daily., Starting Thu 12/07/2015, Print        I personally performed the services described in this documentation, which was scribed in my presence. The recorded information has been reviewed and is accurate.     Clayton Bibles, PA-C 12/07/15 [place], MD 12/07/15 [phone]

## 2015-12-07 NOTE — ED Triage Notes (Signed)
Patient states she has a sore throat with bilateral ear pain.  She is having some nasal congestion.  She states she is having pressure from the nasal congestion.    Patient also states she hasn't taken her BP medication today.

## 2015-12-07 NOTE — Discharge Instructions (Signed)
Read the information below.  Use the prescribed medication as directed.  Please discuss all new medications with your pharmacist.  You may return to the Emergency Department at any time for worsening condition or any new symptoms that concern you.  If you develop high fevers that do not resolve with tylenol or ibuprofen, you have difficulty swallowing or breathing, or you are unable to tolerate fluids by mouth, return to the ER for a recheck.    °

## 2015-12-09 LAB — CULTURE, GROUP A STREP (THRC)

## 2016-02-20 LAB — OB RESULTS CONSOLE RPR: RPR: NONREACTIVE

## 2016-02-20 LAB — OB RESULTS CONSOLE HIV ANTIBODY (ROUTINE TESTING): HIV: NONREACTIVE

## 2016-02-20 LAB — OB RESULTS CONSOLE GC/CHLAMYDIA
Chlamydia: NEGATIVE
Gonorrhea: NEGATIVE

## 2016-02-20 LAB — OB RESULTS CONSOLE RUBELLA ANTIBODY, IGM: Rubella: IMMUNE

## 2016-02-20 LAB — OB RESULTS CONSOLE HEPATITIS B SURFACE ANTIGEN: HEP B S AG: NEGATIVE

## 2016-03-23 ENCOUNTER — Encounter (HOSPITAL_COMMUNITY): Payer: Self-pay | Admitting: *Deleted

## 2016-03-23 ENCOUNTER — Inpatient Hospital Stay (HOSPITAL_COMMUNITY)
Admission: AD | Admit: 2016-03-23 | Discharge: 2016-03-23 | Disposition: A | Discharge status: Home / Self Care | Payer: Medicaid Other | Source: Ambulatory Visit | Attending: Obstetrics and Gynecology | Admitting: Obstetrics and Gynecology

## 2016-03-23 DIAGNOSIS — O210 Mild hyperemesis gravidarum: Secondary | ICD-10-CM | POA: Diagnosis not present

## 2016-03-23 DIAGNOSIS — O219 Vomiting of pregnancy, unspecified: Secondary | ICD-10-CM | POA: Diagnosis not present

## 2016-03-23 DIAGNOSIS — R109 Unspecified abdominal pain: Secondary | ICD-10-CM | POA: Diagnosis present

## 2016-03-23 DIAGNOSIS — Z3A12 12 weeks gestation of pregnancy: Secondary | ICD-10-CM | POA: Insufficient documentation

## 2016-03-23 DIAGNOSIS — Z87891 Personal history of nicotine dependence: Secondary | ICD-10-CM | POA: Diagnosis not present

## 2016-03-23 DIAGNOSIS — R112 Nausea with vomiting, unspecified: Secondary | ICD-10-CM

## 2016-03-23 DIAGNOSIS — R197 Diarrhea, unspecified: Secondary | ICD-10-CM | POA: Insufficient documentation

## 2016-03-23 DIAGNOSIS — O209 Hemorrhage in early pregnancy, unspecified: Secondary | ICD-10-CM | POA: Diagnosis not present

## 2016-03-23 LAB — URINALYSIS, ROUTINE W REFLEX MICROSCOPIC
Bilirubin Urine: NEGATIVE
GLUCOSE, UA: NEGATIVE mg/dL
Ketones, ur: NEGATIVE mg/dL
LEUKOCYTES UA: NEGATIVE
Nitrite: NEGATIVE
PH: 6.5 (ref 5.0–8.0)
Protein, ur: >300 mg/dL — AB
SPECIFIC GRAVITY, URINE: 1.025 (ref 1.005–1.030)

## 2016-03-23 LAB — URINE MICROSCOPIC-ADD ON

## 2016-03-23 MED ORDER — ONDANSETRON 8 MG PO TBDP
8.0000 mg | ORAL_TABLET | Freq: Once | ORAL | Status: AC
  Administered 2016-03-23: 8 mg via ORAL
  Filled 2016-03-23: qty 1

## 2016-03-23 NOTE — MAU Note (Signed)
N/V x 2 days unable to keep anything down.  Last ob appointment 1 week ago.  Has Zofran for nausea, but has not taken it x 1 week because it comes back up.   Belly cramping today. Noticed light bleeding this morning with wiping and in underwear (quarter size stain).

## 2016-03-23 NOTE — Discharge Instructions (Signed)
Call your doctor if you are not improving. If diarrhea continues by Sunday afternoon, you can try Immodium by the package directions.

## 2016-03-23 NOTE — MAU Provider Note (Signed)
History     CSN: [id number]  Arrival date and time: 03/23/16 1225   First Provider Initiated Contact with Patient 03/23/16 1249      Chief Complaint  Patient presents with  . Abdominal Cramping  . Emesis During Pregnancy   HPI Carly Taylor, 20 y.o. [redacted]w[redacted]d  Comes to MAU with vomiting and diarrhea since Friday at 11 am.  Has 5-6 watery stools per day.  Has not been able to keep down food or fluids but has taken her meds for her blood pressure and they stayed down.  Noticed a quarter size amount of blood in her underwear on awakening.  Had some spotting but now at the hospital, the bleeding has stopped.    OB History    Gravida Para Term Preterm AB Living   1             SAB TAB Ectopic Multiple Live Births                  Past Medical History:  Diagnosis Date  . Hypertension   . IgA nephropathy   . IgA nephropathy   . Kidney disease     Past Surgical History:  Procedure Laterality Date  . APPENDECTOMY      No family history on file.  Social History  Substance Use Topics  . Smoking status: Former Smoker    Packs/day: 0.00    Types: Cigarettes  . Smokeless tobacco: Never Used  . Alcohol use No    Allergies:  Allergies  Allergen Reactions  . Nsaids Other (See Comments)    Pt is unable to take this medication due to kidney disease.      Prescriptions Prior to Admission  Medication Sig Dispense Refill Last Dose  . hydrochlorothiazide (HYDRODIURIL) 25 MG tablet Take 25 mg by mouth daily.   03/23/2016 at Unknown time  . methyldopa (ALDOMET) 500 MG tablet Take 500 mg by mouth 2 (two) times daily.   03/23/2016 at Unknown time  . Prenatal Vit-Fe Fumarate-FA (PRENATAL MULTIVITAMIN) TABS tablet Take 1 tablet by mouth daily.   03/23/2016 at Unknown time    Review of Systems  Constitutional: Negative for fever.  Gastrointestinal: Positive for nausea and vomiting.       Some lower abdominal cramping  Genitourinary:       No vaginal discharge. Small amount of  vaginal bleeding - has stopped now No dysuria.    Physical Exam   Blood pressure 139/80, pulse 91, temperature 98.1 F (36.7 C), resp. rate 18, height 5\' 3"  (1.6 m), weight 205 lb 6.4 oz (93.2 kg).  Physical Exam  Nursing note and vitals reviewed. Constitutional: She is oriented to person, place, and time. She appears well-developed and well-nourished.  HENT:  Head: Normocephalic.  Eyes: EOM are normal.  Neck: Neck supple.  GI: Soft. There is no tenderness.  Informal ultrasound at bedside by CNM shows 2 heartbeats.  Musculoskeletal: Normal range of motion.  Neurological: She is alert and oriented to person, place, and time.  Skin: Skin is warm and dry.  Psychiatric: She has a normal mood and affect.    MAU Course  Procedures  Results for orders placed or performed during the hospital encounter of 03/23/16 (from the past 24 hour(s))  Urinalysis, Routine w reflex microscopic (not at [facility])     Status: Abnormal   Collection Time: 03/23/16 12:34 PM  Result Value Ref Range   Color, Urine YELLOW YELLOW   APPearance CLEAR CLEAR  Specific Gravity, Urine 1.025 1.005 - 1.030   pH 6.5 5.0 - 8.0   Glucose, UA NEGATIVE NEGATIVE mg/dL   Hgb urine dipstick SMALL (A) NEGATIVE   Bilirubin Urine NEGATIVE NEGATIVE   Ketones, ur NEGATIVE NEGATIVE mg/dL   Protein, ur >300 (A) NEGATIVE mg/dL   Nitrite NEGATIVE NEGATIVE   Leukocytes, UA NEGATIVE NEGATIVE  Urine microscopic-add on     Status: Abnormal   Collection Time: 03/23/16 12:34 PM  Result Value Ref Range   Squamous Epithelial / LPF 6-30 (A) NONE SEEN   WBC, UA 0-5 0 - 5 WBC/hpf   RBC / HPF 0-5 0 - 5 RBC/hpf   Bacteria, UA MANY (A) NONE SEEN   Urine-Other MUCOUS PRESENT     MDM Consult with Dr. Terri Piedra  - Confirm FHT and client may go home. Urine results have no ketones, so IVF not indicated today. Reviewed liquid and bland diet for today.  Reviewed foods to avoid.  If still having diarrhea tomorrow, can try OTC Immodium.  Has  vomited a small amount once while in MAU - no stools.  Client has Zofran tablets to swallow at home but was not able to keep it down.  Offered phenergan but since client has Zofran, prefers Zofran.  Will given one Zofran ODT today while here in MAU.  Assessment and Plan  Nausea, vomiting and diarrhea Vaginal bleeding in first trimester   Plan Liquids and bland diet today.  BRAT diet also recommended. If you continue to be ill, see your doctor on Monday.  Terri L Burleson 03/23/2016, 1:17 PM

## 2016-03-30 ENCOUNTER — Emergency Department (HOSPITAL_COMMUNITY)
Admission: EM | Admit: 2016-03-30 | Discharge: 2016-03-30 | Disposition: A | Discharge status: Home / Self Care | Payer: Medicaid Other | Attending: Dermatology | Admitting: Dermatology

## 2016-03-30 ENCOUNTER — Encounter (HOSPITAL_COMMUNITY): Payer: Self-pay

## 2016-03-30 DIAGNOSIS — Y92410 Unspecified street and highway as the place of occurrence of the external cause: Secondary | ICD-10-CM | POA: Insufficient documentation

## 2016-03-30 DIAGNOSIS — Y939 Activity, unspecified: Secondary | ICD-10-CM | POA: Insufficient documentation

## 2016-03-30 DIAGNOSIS — S199XXA Unspecified injury of neck, initial encounter: Secondary | ICD-10-CM | POA: Insufficient documentation

## 2016-03-30 DIAGNOSIS — I10 Essential (primary) hypertension: Secondary | ICD-10-CM | POA: Diagnosis not present

## 2016-03-30 DIAGNOSIS — Y999 Unspecified external cause status: Secondary | ICD-10-CM | POA: Diagnosis not present

## 2016-03-30 DIAGNOSIS — O9A211 Injury, poisoning and certain other consequences of external causes complicating pregnancy, first trimester: Secondary | ICD-10-CM | POA: Insufficient documentation

## 2016-03-30 DIAGNOSIS — F17210 Nicotine dependence, cigarettes, uncomplicated: Secondary | ICD-10-CM | POA: Insufficient documentation

## 2016-03-30 DIAGNOSIS — Z3A13 13 weeks gestation of pregnancy: Secondary | ICD-10-CM | POA: Insufficient documentation

## 2016-03-30 DIAGNOSIS — Z5321 Procedure and treatment not carried out due to patient leaving prior to being seen by health care provider: Secondary | ICD-10-CM | POA: Diagnosis not present

## 2016-03-30 NOTE — ED Notes (Signed)
Patient left stated she is going to [facility]
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
## 2016-03-30 NOTE — ED Notes (Signed)
Acuity changed to 3 per PA order. Pt moved back to lobby for placement in acute bed. Urine obtained and left at triage. No orders at this time.

## 2016-03-31 ENCOUNTER — Ambulatory Visit (HOSPITAL_COMMUNITY)
Admission: EM | Admit: 2016-03-31 | Discharge: 2016-03-31 | Discharge status: Absent without leave | Payer: Medicaid Other

## 2016-03-31 ENCOUNTER — Inpatient Hospital Stay (HOSPITAL_COMMUNITY)
Admission: AD | Admit: 2016-03-31 | Discharge: 2016-03-31 | Disposition: A | Discharge status: Home / Self Care | Payer: Medicaid Other | Source: Ambulatory Visit | Attending: Obstetrics and Gynecology | Admitting: Obstetrics and Gynecology

## 2016-03-31 ENCOUNTER — Encounter (HOSPITAL_COMMUNITY): Payer: Self-pay | Admitting: *Deleted

## 2016-03-31 DIAGNOSIS — O99331 Smoking (tobacco) complicating pregnancy, first trimester: Secondary | ICD-10-CM | POA: Diagnosis not present

## 2016-03-31 DIAGNOSIS — S7001XA Contusion of right hip, initial encounter: Secondary | ICD-10-CM

## 2016-03-31 DIAGNOSIS — O30002 Twin pregnancy, unspecified number of placenta and unspecified number of amniotic sacs, second trimester: Secondary | ICD-10-CM | POA: Diagnosis not present

## 2016-03-31 DIAGNOSIS — F17210 Nicotine dependence, cigarettes, uncomplicated: Secondary | ICD-10-CM | POA: Insufficient documentation

## 2016-03-31 DIAGNOSIS — E669 Obesity, unspecified: Secondary | ICD-10-CM | POA: Insufficient documentation

## 2016-03-31 DIAGNOSIS — O26831 Pregnancy related renal disease, first trimester: Secondary | ICD-10-CM | POA: Diagnosis not present

## 2016-03-31 DIAGNOSIS — I129 Hypertensive chronic kidney disease with stage 1 through stage 4 chronic kidney disease, or unspecified chronic kidney disease: Secondary | ICD-10-CM

## 2016-03-31 DIAGNOSIS — Z3A13 13 weeks gestation of pregnancy: Secondary | ICD-10-CM | POA: Diagnosis not present

## 2016-03-31 DIAGNOSIS — O99211 Obesity complicating pregnancy, first trimester: Secondary | ICD-10-CM | POA: Diagnosis not present

## 2016-03-31 DIAGNOSIS — S161XXA Strain of muscle, fascia and tendon at neck level, initial encounter: Secondary | ICD-10-CM | POA: Diagnosis not present

## 2016-03-31 DIAGNOSIS — O161 Unspecified maternal hypertension, first trimester: Secondary | ICD-10-CM | POA: Insufficient documentation

## 2016-03-31 DIAGNOSIS — N289 Disorder of kidney and ureter, unspecified: Secondary | ICD-10-CM | POA: Insufficient documentation

## 2016-03-31 DIAGNOSIS — S7002XA Contusion of left hip, initial encounter: Secondary | ICD-10-CM | POA: Diagnosis not present

## 2016-03-31 DIAGNOSIS — O9A211 Injury, poisoning and certain other consequences of external causes complicating pregnancy, first trimester: Secondary | ICD-10-CM | POA: Diagnosis present

## 2016-03-31 DIAGNOSIS — F17200 Nicotine dependence, unspecified, uncomplicated: Secondary | ICD-10-CM

## 2016-03-31 DIAGNOSIS — O30001 Twin pregnancy, unspecified number of placenta and unspecified number of amniotic sacs, first trimester: Secondary | ICD-10-CM | POA: Insufficient documentation

## 2016-03-31 HISTORY — DX: Unspecified infectious disease: B99.9

## 2016-03-31 HISTORY — DX: Unspecified ovarian cyst, unspecified side: N83.209

## 2016-03-31 LAB — URINE MICROSCOPIC-ADD ON

## 2016-03-31 LAB — URINALYSIS, ROUTINE W REFLEX MICROSCOPIC
Bilirubin Urine: NEGATIVE
GLUCOSE, UA: NEGATIVE mg/dL
Ketones, ur: NEGATIVE mg/dL
LEUKOCYTES UA: NEGATIVE
Nitrite: NEGATIVE
PH: 6 (ref 5.0–8.0)
PROTEIN: 100 mg/dL — AB
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
## 2016-03-31 NOTE — Discharge Instructions (Signed)
Contusion A contusion is a deep bruise. Contusions are the result of a blunt injury to tissues and muscle fibers under the skin. The injury causes bleeding under the skin. The skin overlying the contusion may turn blue, purple, or yellow. Minor injuries will give you a painless contusion, but more severe contusions may stay painful and swollen for a few weeks. What are the causes? This condition is usually caused by a blow, trauma, or direct force to an area of the body. What are the signs or symptoms? Symptoms of this condition include:  Swelling of the injured area.  Pain and tenderness in the injured area.  Discoloration. The area may have redness and then turn blue, purple, or yellow. How is this diagnosed? This condition is diagnosed based on a physical exam and medical history. An X-ray, CT scan, or MRI may be needed to determine if there are any associated injuries, such as broken bones (fractures). How is this treated? Specific treatment for this condition depends on what area of the body was injured. In general, the best treatment for a contusion is resting, icing, applying pressure to (compression), and elevating the injured area. This is often called the RICE strategy. Over-the-counter anti-inflammatory medicines may also be recommended for pain control. Follow these instructions at home:  Rest the injured area.  If directed, apply ice to the injured area:  Put ice in a plastic bag.  Place a towel between your skin and the bag.  Leave the ice on for 20 minutes, 2-3 times per day.  If directed, apply light compression to the injured area using an elastic bandage. Make sure the bandage is not wrapped too tightly. Remove and reapply the bandage as directed by your health care provider.  If possible, raise (elevate) the injured area above the level of your heart while you are sitting or lying down.  Take over-the-counter and prescription medicines only as told by your health  care provider. Contact a health care provider if:  Your symptoms do not improve after several days of treatment.  Your symptoms get worse.  You have difficulty moving the injured area. Get help right away if:  You have severe pain.  You have numbness in a hand or foot.  Your hand or foot turns pale or cold. This information is not intended to replace advice given to you by your health care provider. Make sure you discuss any questions you have with your health care provider. Document Released: 01/30/2005 Document Revised: 08/31/2015 Document Reviewed: 09/07/2014 Elsevier Interactive Patient Education  2017 [employer]  
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
## 2016-04-23 ENCOUNTER — Emergency Department (HOSPITAL_COMMUNITY)
Admission: EM | Admit: 2016-04-23 | Discharge: 2016-04-23 | Disposition: A | Discharge status: Home / Self Care | Payer: Medicaid Other | Attending: Emergency Medicine | Admitting: Emergency Medicine

## 2016-04-23 ENCOUNTER — Emergency Department (HOSPITAL_COMMUNITY): Payer: Medicaid Other

## 2016-04-23 ENCOUNTER — Encounter (HOSPITAL_COMMUNITY): Payer: Self-pay

## 2016-04-23 DIAGNOSIS — Z3A17 17 weeks gestation of pregnancy: Secondary | ICD-10-CM | POA: Insufficient documentation

## 2016-04-23 DIAGNOSIS — F17210 Nicotine dependence, cigarettes, uncomplicated: Secondary | ICD-10-CM | POA: Diagnosis not present

## 2016-04-23 DIAGNOSIS — I10 Essential (primary) hypertension: Secondary | ICD-10-CM | POA: Diagnosis not present

## 2016-04-23 DIAGNOSIS — O99332 Smoking (tobacco) complicating pregnancy, second trimester: Secondary | ICD-10-CM | POA: Insufficient documentation

## 2016-04-23 DIAGNOSIS — R102 Pelvic and perineal pain: Secondary | ICD-10-CM | POA: Diagnosis not present

## 2016-04-23 DIAGNOSIS — O26892 Other specified pregnancy related conditions, second trimester: Secondary | ICD-10-CM | POA: Diagnosis present

## 2016-04-23 DIAGNOSIS — Z79899 Other long term (current) drug therapy: Secondary | ICD-10-CM | POA: Diagnosis not present

## 2016-04-23 LAB — URINALYSIS, ROUTINE W REFLEX MICROSCOPIC
Bilirubin Urine: NEGATIVE
GLUCOSE, UA: NEGATIVE mg/dL
HGB URINE DIPSTICK: NEGATIVE
Ketones, ur: NEGATIVE mg/dL
Leukocytes, UA: NEGATIVE
Nitrite: NEGATIVE
PROTEIN: 100 mg/dL — AB
Specific Gravity, Urine: 1.013 (ref 1.005–1.030)
pH: 6 (ref 5.0–8.0)

## 2016-04-23 LAB — CBC WITH DIFFERENTIAL/PLATELET
BASOS ABS: 0 10*3/uL (ref 0.0–0.1)
Basophils Relative: 0 %
EOS PCT: 1 %
Eosinophils Absolute: 0.1 10*3/uL (ref 0.0–0.7)
HEMATOCRIT: 34.6 % — AB (ref 36.0–46.0)
Hemoglobin: 12 g/dL (ref 12.0–15.0)
LYMPHS ABS: 2.2 10*3/uL (ref 0.7–4.0)
Lymphocytes Relative: 26 %
MCH: 28.8 pg (ref 26.0–34.0)
MCHC: 34.7 g/dL (ref 30.0–36.0)
MCV: 83.2 fL (ref 78.0–100.0)
MONOS PCT: 3 %
Monocytes Absolute: 0.3 10*3/uL (ref 0.1–1.0)
NEUTROS ABS: 5.8 10*3/uL (ref 1.7–7.7)
Neutrophils Relative %: 70 %
Platelets: 216 10*3/uL (ref 150–400)
RBC: 4.16 MIL/uL (ref 3.87–5.11)
RDW: 17 % — AB (ref 11.5–15.5)
WBC: 8.4 10*3/uL (ref 4.0–10.5)

## 2016-04-23 LAB — COMPREHENSIVE METABOLIC PANEL
ALT: 12 U/L — ABNORMAL LOW (ref 14–54)
ANION GAP: 8 (ref 5–15)
AST: 18 U/L (ref 15–41)
Albumin: 2.5 g/dL — ABNORMAL LOW (ref 3.5–5.0)
Alkaline Phosphatase: 62 U/L (ref 38–126)
BILIRUBIN TOTAL: 0.5 mg/dL (ref 0.3–1.2)
BUN: <5 mg/dL — ABNORMAL LOW (ref 6–20)
CO2: 25 mmol/L (ref 22–32)
Calcium: 9.2 mg/dL (ref 8.9–10.3)
Chloride: 105 mmol/L (ref 101–111)
Creatinine, Ser: 0.56 mg/dL (ref 0.44–1.00)
GFR calc Af Amer: >60 mL/min (ref >60)
Glucose, Bld: 100 mg/dL — ABNORMAL HIGH (ref 65–99)
POTASSIUM: 3.3 mmol/L — AB (ref 3.5–5.1)
Sodium: 138 mmol/L (ref 135–145)
TOTAL PROTEIN: 5 g/dL — AB (ref 6.5–8.1)

## 2016-04-23 LAB — LIPASE, BLOOD: LIPASE: 15 U/L (ref 11–51)

## 2016-04-23 MED ORDER — HYDROCODONE-ACETAMINOPHEN 5-325 MG PO TABS
1.0000 | ORAL_TABLET | Freq: Once | ORAL | Status: AC
  Administered 2016-04-23: 1 via ORAL
  Filled 2016-04-23: qty 1

## 2016-04-23 NOTE — Discharge Instructions (Signed)
Please follow up with your OBGYN Take Tylenol for pain at home

## 2016-04-23 NOTE — ED Notes (Signed)
Patient at u/s 

## 2016-04-23 NOTE — ED Notes (Signed)
EDP at bedside  

## 2016-04-23 NOTE — ED Triage Notes (Signed)
Per Pt, Pt is coming from home with complaints of lower back pain and right lower quadrant pain. Pt reports being [redacted] weeks pregnant with twins. Denies any vaginal discharge or bleeding. Reports both babies have been [place] and noted in the Uterus. Hx of Kidney Disease.

## 2016-04-23 NOTE — ED Provider Notes (Signed)
[facility] DEPT Provider Note   CSN: [id number] Arrival date & time: 04/23/16  1031     History   Chief Complaint Chief Complaint  Patient presents with  . Pelvic Pain  . Back Pain    HPI Carly Taylor is a 20 y.o. G1P0 female who presents with abdominal pain and flank pain. PMH significant for kidney disease due to IgA nephropathy, HTN. Past surgical hx significant for appendectomy. She is currently [redacted] weeks pregnant with twins. She has been getting regular prenatal care. This morning she developed an acute onset of right lower abdominal/pelvic pain. It is constant, feels like a tightness/presure. It radiates to the right flank which feels sharp. Also having pain in the left lower pelvic region and left flank but this is milder. Pain is currently 7/10. She endorses mild nausea and diarrhea which started today. Also is having urinary frequency. She denies fever, chills, chest pain, SOB, vomiting, constipation, dysuria, hematuria, vaginal discharge or bleeding.   HPI  Past Medical History:  Diagnosis Date  . Hypertension    on meds  . IgA nephropathy   . IgA nephropathy   . Infection    UTI  . Kidney disease    "C1Q"  . Ovarian cyst     There are no active problems to display for this patient.   Past Surgical History:  Procedure Laterality Date  . APPENDECTOMY      OB History    Gravida Para Term Preterm AB Living   1             SAB TAB Ectopic Multiple Live Births                   Home Medications    Prior to Admission medications   Medication Sig Start Date End Date Taking? Authorizing Provider  hydrochlorothiazide (HYDRODIURIL) 25 MG tablet Take 25 mg by mouth daily.    Historical Provider, MD  methyldopa (ALDOMET) 500 MG tablet Take 500 mg by mouth 2 (two) times daily.    Historical Provider, MD  Prenatal Vit-Fe Fumarate-FA (PRENATAL MULTIVITAMIN) TABS tablet Take 1 tablet by mouth daily.    Historical Provider, MD    Family History Family  History  Problem Relation Age of Onset  . Heart disease Father   . Stroke Father   . Kidney disease Paternal Aunt   . Hypertension Maternal Grandmother   . Diabetes Maternal Grandmother   . Hypertension Maternal Grandfather   . Hypertension Paternal Grandmother   . Heart disease Paternal Grandmother   . Hypertension Paternal Grandfather   . Heart disease Paternal Grandfather     Social History Social History  Substance Use Topics  . Smoking status: Current Every Day Smoker    Packs/day: 0.25    Years: 1.00    Types: Cigarettes, E-cigarettes  . Smokeless tobacco: Never Used  . Alcohol use No     Allergies   Nsaids   Review of Systems Review of Systems  Constitutional: Negative for chills and fever.  Respiratory: Negative for shortness of breath.   Cardiovascular: Negative for chest pain.  Gastrointestinal: Positive for abdominal pain, diarrhea and nausea. Negative for constipation and vomiting.  Genitourinary: Positive for flank pain, frequency and pelvic pain. Negative for dysuria, hematuria, vaginal bleeding and vaginal discharge.  All other systems reviewed and are negative.    Physical Exam Updated Vital Signs BP 128/79 (BP Location: Right Arm)   Pulse 82   Temp 97.6 F (36.4 C) (Oral)  Resp 16   Ht 5\' 3"  (1.6 m)   Wt 94.8 kg   LMP  (LMP Unknown)   SpO2 100%   BMI 37.02 kg/m   Physical Exam  Constitutional: She is oriented to person, place, and time. She appears well-developed and well-nourished. No distress.  Obese, NAD  HENT:  Head: Normocephalic and atraumatic.  Eyes: Conjunctivae are normal. Pupils are equal, round, and reactive to light. Right eye exhibits no discharge. Left eye exhibits no discharge. No scleral icterus.  Neck: Normal range of motion.  Cardiovascular: Normal rate and regular rhythm.  Exam reveals no gallop and no friction rub.   No murmur heard. Pulmonary/Chest: Effort normal and breath sounds normal. No respiratory distress.  She has no wheezes. She has no rales. She exhibits no tenderness.  Abdominal: Soft. Bowel sounds are normal. She exhibits no distension and no mass. There is tenderness. There is no rebound and no guarding. No hernia.  Bilateral CVA tenderness R>L. RUQ, RLQ, and LLQ tenderness.   Neurological: She is alert and oriented to person, place, and time.  Skin: Skin is warm and dry.  Psychiatric: She has a normal mood and affect. Her behavior is normal.  Nursing note and vitals reviewed.    ED Treatments / Results  Labs (all labs ordered are listed, but only abnormal results are displayed) Labs Reviewed  URINALYSIS, ROUTINE W REFLEX MICROSCOPIC - Abnormal; Notable for the following:       Result Value   APPearance CLOUDY (*)    Protein, ur 100 (*)    Bacteria, UA RARE (*)    Squamous Epithelial / LPF 6-30 (*)    All other components within normal limits  CBC WITH DIFFERENTIAL/PLATELET - Abnormal; Notable for the following:    HCT 34.6 (*)    RDW 17.0 (*)    All other components within normal limits  COMPREHENSIVE METABOLIC PANEL - Abnormal; Notable for the following:    Potassium 3.3 (*)    Glucose, Bld 100 (*)    BUN <5 (*)    Total Protein 5.0 (*)    Albumin 2.5 (*)    ALT 12 (*)    All other components within normal limits  LIPASE, BLOOD    EKG  EKG Interpretation None       Radiology No results found.  Procedures Procedures (including critical care time)  Medications Ordered in ED Medications  HYDROcodone-acetaminophen (NORCO/VICODIN) 5-325 MG per tablet 1 tablet (1 tablet Oral Given 04/23/16 1522)     Initial Impression / Assessment and Plan / ED Course  I have reviewed the triage vital signs and the nursing notes.  Pertinent labs & imaging results that were available during my care of the patient were reviewed by me and considered in my medical decision making (see chart for details).  Clinical Course    20 year old female presents with pelvic pain,  abdominal pain, flank pain. Unclear etiology. Possibly some round ligament pain. Vitals reviewed and he is afebrile, not tachycardic, normotensive, and not hypoxic. Labs are overall unremarkable. UA clean. Abdominal US shows no acute findings. OB US shows living twin IUP. Pain treated in ED. Reassurance given. Recommend follow up with OBGYN. Patient is NAD, non-toxic, with stable VS. Patient is informed of clinical course, understands medical decision making process, and agrees with plan. Opportunity for questions provided and all questions answered. Return precautions given.   Final Clinical Impressions(s) / ED Diagnoses   Final diagnoses:  Pelvic pain affecting pregnancy in  second trimester, antepartum    New Prescriptions New Prescriptions   No medications on file     Recardo Evangelist, PA-C 04/29/16 [place], MD 05/03/16 [phone]

## 2016-04-23 NOTE — ED Notes (Signed)
Patient sent to u/s.

## 2016-07-08 ENCOUNTER — Encounter (HOSPITAL_COMMUNITY): Payer: Self-pay | Admitting: Family Medicine

## 2016-07-08 ENCOUNTER — Ambulatory Visit (HOSPITAL_COMMUNITY)
Admission: EM | Admit: 2016-07-08 | Discharge: 2016-07-08 | Disposition: A | Discharge status: Home / Self Care | Payer: Medicaid Other | Attending: Family Medicine | Admitting: Family Medicine

## 2016-07-08 DIAGNOSIS — J40 Bronchitis, not specified as acute or chronic: Secondary | ICD-10-CM | POA: Diagnosis not present

## 2016-07-08 MED ORDER — PREDNISONE 20 MG PO TABS: ORAL_TABLET | ORAL | 0 refills | Status: DC

## 2016-07-08 MED ORDER — AMOXICILLIN 875 MG PO TABS: 875.0000 mg | ORAL_TABLET | Freq: Two times a day (BID) | ORAL | 0 refills | Status: DC

## 2016-07-08 NOTE — ED Provider Notes (Signed)
[place]    CSN: [id number] Arrival date & time: 07/08/16  1720     History   Chief Complaint Chief Complaint  Patient presents with  . Cough  . Nasal Congestion    HPI Carly Taylor is a 21 y.o. female.   Pt here for URI symptoms. This 21 year old woman who works at [employer] is [redacted] weeks pregnant and has had coughing and wheezing for 6 days. She's having trouble sleeping because of the cough. She initially had some scratchiness of her throat and congestion but that is all cleared.  Patient is able to keep fluids down since not having any nausea or vomiting. She's not had any fever during this time.      Past Medical History:  Diagnosis Date  . Hypertension    on meds  . IgA nephropathy   . IgA nephropathy   . Infection    UTI  . Kidney disease    "C1Q"  . Ovarian cyst     There are no active problems to display for this patient.   Past Surgical History:  Procedure Laterality Date  . APPENDECTOMY      OB History    Gravida Para Term Preterm AB Living   1             SAB TAB Ectopic Multiple Live Births                   Home Medications    Prior to Admission medications   Medication Sig Start Date End Date Taking? Authorizing Provider  amoxicillin (AMOXIL) 875 MG tablet Take 1 tablet (875 mg total) by mouth 2 (two) times daily. 07/08/16   Robyn Haber, MD  hydrochlorothiazide (HYDRODIURIL) 25 MG tablet Take 25 mg by mouth daily.    Historical Provider, MD  methyldopa (ALDOMET) 500 MG tablet Take 500 mg by mouth 2 (two) times daily.    Historical Provider, MD  predniSONE (DELTASONE) 20 MG tablet Two daily with food 07/08/16   Robyn Haber, MD  Prenatal Vit-Fe Fumarate-FA (PRENATAL MULTIVITAMIN) TABS tablet Take 1 tablet by mouth daily.    Historical Provider, MD    Family History Family History  Problem Relation Age of Onset  . Heart disease Father   . Stroke Father   . Kidney disease Paternal Aunt   . Hypertension  Maternal Grandmother   . Diabetes Maternal Grandmother   . Hypertension Maternal Grandfather   . Hypertension Paternal Grandmother   . Heart disease Paternal Grandmother   . Hypertension Paternal Grandfather   . Heart disease Paternal Grandfather     Social History Social History  Substance Use Topics  . Smoking status: Current Every Day Smoker    Packs/day: 0.25    Years: 1.00    Types: Cigarettes, E-cigarettes  . Smokeless tobacco: Never Used  . Alcohol use No     Allergies   Nsaids   Review of Systems Review of Systems  Constitutional: Positive for fatigue.  HENT: Negative.   Respiratory: Positive for cough and wheezing.   Cardiovascular: Negative.   Gastrointestinal: Negative.   Neurological: Negative.      Physical Exam Triage Vital Signs ED Triage Vitals [07/08/16 1728]  Enc Vitals Group     BP 149/94     Pulse Rate 120     Resp 20     Temp 98.3 F (36.8 C)     Temp src      SpO2 97 %  Weight      Height      Head Circumference      Peak Flow      Pain Score      Pain Loc      Pain Edu?      Excl. in [place]?    No data found.   Updated Vital Signs BP 149/94   Pulse 120   Temp 98.3 F (36.8 C)   Resp 20   LMP  (LMP Unknown)   SpO2 97%    Physical Exam  Constitutional: She is oriented to person, place, and time. She appears well-developed and well-nourished.  HENT:  Head: Normocephalic.  Right Ear: External ear normal.  Left Ear: External ear normal.  Mouth/Throat: Oropharynx is clear and moist.  Eyes: Conjunctivae and EOM are normal. Pupils are equal, round, and reactive to light.  Neck: Normal range of motion. Neck supple.  Cardiovascular: Normal rate and normal heart sounds.   No murmur heard. Pulmonary/Chest: Effort normal. She has wheezes.  Abdominal: Soft. Bowel sounds are normal.  Musculoskeletal: Normal range of motion.  Neurological: She is alert and oriented to person, place, and time.  Skin: Skin is warm and dry.    Nursing note and vitals reviewed.    UC Treatments / Results  Labs (all labs ordered are listed, but only abnormal results are displayed) Labs Reviewed - No data to display  EKG  EKG Interpretation None       Radiology No results found.  Procedures Procedures (including critical care time)  Medications Ordered in UC Medications - No data to display   Initial Impression / Assessment and Plan / UC Course  I have reviewed the triage vital signs and the nursing notes.  Pertinent labs & imaging results that were available during my care of the patient were reviewed by me and considered in my medical decision making (see chart for details).     Final Clinical Impressions(s) / UC Diagnoses   Final diagnoses:  Bronchitis    New Prescriptions New Prescriptions   AMOXICILLIN (AMOXIL) 875 MG TABLET    Take 1 tablet (875 mg total) by mouth 2 (two) times daily.   PREDNISONE (DELTASONE) 20 MG TABLET    Two daily with food     Robyn Haber, MD 07/08/16 1747

## 2016-07-08 NOTE — ED Triage Notes (Signed)
Pt here for URI symptoms.  

## 2016-07-11 LAB — OB RESULTS CONSOLE RPR: RPR: NONREACTIVE

## 2016-07-11 LAB — OB RESULTS CONSOLE HIV ANTIBODY (ROUTINE TESTING): HIV: NONREACTIVE

## 2016-08-05 ENCOUNTER — Inpatient Hospital Stay (HOSPITAL_COMMUNITY): Payer: Medicaid Other

## 2016-08-05 ENCOUNTER — Inpatient Hospital Stay (HOSPITAL_COMMUNITY)
Admission: AD | Admit: 2016-08-05 | Discharge: 2016-08-05 | Disposition: A | Discharge status: Home / Self Care | Payer: Medicaid Other | Source: Ambulatory Visit | Attending: Obstetrics and Gynecology | Admitting: Obstetrics and Gynecology

## 2016-08-05 ENCOUNTER — Encounter (HOSPITAL_COMMUNITY): Payer: Self-pay | Admitting: *Deleted

## 2016-08-05 DIAGNOSIS — M545 Low back pain: Secondary | ICD-10-CM | POA: Diagnosis present

## 2016-08-05 DIAGNOSIS — O163 Unspecified maternal hypertension, third trimester: Secondary | ICD-10-CM | POA: Insufficient documentation

## 2016-08-05 DIAGNOSIS — Z79899 Other long term (current) drug therapy: Secondary | ICD-10-CM | POA: Diagnosis not present

## 2016-08-05 DIAGNOSIS — Z3A31 31 weeks gestation of pregnancy: Secondary | ICD-10-CM | POA: Diagnosis not present

## 2016-08-05 DIAGNOSIS — Z3689 Encounter for other specified antenatal screening: Secondary | ICD-10-CM

## 2016-08-05 DIAGNOSIS — O99333 Smoking (tobacco) complicating pregnancy, third trimester: Secondary | ICD-10-CM | POA: Diagnosis not present

## 2016-08-05 DIAGNOSIS — O9989 Other specified diseases and conditions complicating pregnancy, childbirth and the puerperium: Secondary | ICD-10-CM

## 2016-08-05 DIAGNOSIS — O10919 Unspecified pre-existing hypertension complicating pregnancy, unspecified trimester: Secondary | ICD-10-CM

## 2016-08-05 DIAGNOSIS — F17210 Nicotine dependence, cigarettes, uncomplicated: Secondary | ICD-10-CM | POA: Diagnosis not present

## 2016-08-05 DIAGNOSIS — M549 Dorsalgia, unspecified: Secondary | ICD-10-CM

## 2016-08-05 DIAGNOSIS — O99891 Other specified diseases and conditions complicating pregnancy: Secondary | ICD-10-CM

## 2016-08-05 DIAGNOSIS — O4703 False labor before 37 completed weeks of gestation, third trimester: Secondary | ICD-10-CM

## 2016-08-05 DIAGNOSIS — O368390 Maternal care for abnormalities of the fetal heart rate or rhythm, unspecified trimester, not applicable or unspecified: Secondary | ICD-10-CM

## 2016-08-05 DIAGNOSIS — O3483 Maternal care for other abnormalities of pelvic organs, third trimester: Secondary | ICD-10-CM | POA: Insufficient documentation

## 2016-08-05 DIAGNOSIS — O26893 Other specified pregnancy related conditions, third trimester: Secondary | ICD-10-CM | POA: Insufficient documentation

## 2016-08-05 LAB — URINALYSIS, ROUTINE W REFLEX MICROSCOPIC
BILIRUBIN URINE: NEGATIVE
Glucose, UA: NEGATIVE mg/dL
Hgb urine dipstick: NEGATIVE
Ketones, ur: NEGATIVE mg/dL
Leukocytes, UA: NEGATIVE
Nitrite: NEGATIVE
SPECIFIC GRAVITY, URINE: 1.012 (ref 1.005–1.030)
pH: 6 (ref 5.0–8.0)

## 2016-08-05 LAB — COMPREHENSIVE METABOLIC PANEL
ALBUMIN: 2 g/dL — AB (ref 3.5–5.0)
ALT: 11 U/L — AB (ref 14–54)
AST: 15 U/L (ref 15–41)
Alkaline Phosphatase: 112 U/L (ref 38–126)
Anion gap: 7 (ref 5–15)
CHLORIDE: 108 mmol/L (ref 101–111)
CO2: 19 mmol/L — ABNORMAL LOW (ref 22–32)
CREATININE: 0.49 mg/dL (ref 0.44–1.00)
Calcium: 8.2 mg/dL — ABNORMAL LOW (ref 8.9–10.3)
GFR calc Af Amer: >60 mL/min (ref >60)
GLUCOSE: 85 mg/dL (ref 65–99)
POTASSIUM: 3.5 mmol/L (ref 3.5–5.1)
Sodium: 134 mmol/L — ABNORMAL LOW (ref 135–145)
Total Bilirubin: 0.1 mg/dL — ABNORMAL LOW (ref 0.3–1.2)
Total Protein: 4.8 g/dL — ABNORMAL LOW (ref 6.5–8.1)

## 2016-08-05 LAB — CBC
HEMATOCRIT: 33.5 % — AB (ref 36.0–46.0)
Hemoglobin: 11.6 g/dL — ABNORMAL LOW (ref 12.0–15.0)
MCH: 30.6 pg (ref 26.0–34.0)
MCHC: 34.6 g/dL (ref 30.0–36.0)
MCV: 88.4 fL (ref 78.0–100.0)
PLATELETS: 221 10*3/uL (ref 150–400)
RBC: 3.79 MIL/uL — AB (ref 3.87–5.11)
RDW: 14.9 % (ref 11.5–15.5)
WBC: 9.3 10*3/uL (ref 4.0–10.5)

## 2016-08-05 LAB — PROTEIN / CREATININE RATIO, URINE
CREATININE, URINE: 97 mg/dL
Protein Creatinine Ratio: 7.34 mg/mg{Cre} — ABNORMAL HIGH (ref 0.00–0.15)
Total Protein, Urine: 712 mg/dL

## 2016-08-05 LAB — FETAL FIBRONECTIN: FETAL FIBRONECTIN: NEGATIVE

## 2016-08-05 MED ORDER — LACTATED RINGERS IV BOLUS (SEPSIS): 1000.0000 mL | Freq: Once | INTRAVENOUS | Status: DC

## 2016-08-05 MED ORDER — ACETAMINOPHEN 500 MG PO TABS
1000.0000 mg | ORAL_TABLET | Freq: Once | ORAL | Status: AC
  Administered 2016-08-05: 1000 mg via ORAL
  Filled 2016-08-05: qty 2

## 2016-08-05 MED ORDER — BETAMETHASONE SOD PHOS & ACET 6 (3-3) MG/ML IJ SUSP
12.0000 mg | Freq: Once | INTRAMUSCULAR | Status: AC
  Administered 2016-08-05: 12 mg via INTRAMUSCULAR
  Filled 2016-08-05: qty 2

## 2016-08-05 MED ORDER — LACTATED RINGERS IV BOLUS (SEPSIS)
500.0000 mL | Freq: Once | INTRAVENOUS | Status: AC
  Administered 2016-08-05: 500 mL via INTRAVENOUS

## 2016-08-05 NOTE — MAU Provider Note (Signed)
History     CSN: [id number]  Arrival date and time: 08/05/16 7829   First Provider Initiated Contact with Patient 08/05/16 1015      Chief Complaint  Patient presents with  . Back Pain   HPI   Ms.Carly Taylor is a 21 y.o. female G1P0 @ 71w5ddi/di twins here in MAU with lower back pain and HA. The patient has a history of IgA nephropathy and hypertension  and is followed by [facility], her next appointment is tomorrow. The back pain started last night. The pain is dull and constant most of the time. The pain called her to toss and turn throughout the night. She has a history of HA's, tylenol works for the pain. She took one dose of tylenol 500 mg for HA which did not help completely. + fetal movement, although baby B's movements seem less. Denies vaginal bleeding or leaking of fluid.   OB History    Gravida Para Term Preterm AB Living   1             SAB TAB Ectopic Multiple Live Births                  Past Medical History:  Diagnosis Date  . Hypertension    on meds  . IgA nephropathy   . IgA nephropathy   . Infection    UTI  . Kidney disease    "C1Q"  . Ovarian cyst     Past Surgical History:  Procedure Laterality Date  . APPENDECTOMY      Family History  Problem Relation Age of Onset  . Heart disease Father   . Stroke Father   . Kidney disease Paternal Aunt   . Hypertension Maternal Grandmother   . Diabetes Maternal Grandmother   . Hypertension Maternal Grandfather   . Hypertension Paternal Grandmother   . Heart disease Paternal Grandmother   . Hypertension Paternal Grandfather   . Heart disease Paternal Grandfather     Social History  Substance Use Topics  . Smoking status: Current Every Day Smoker    Packs/day: 0.25    Years: 1.00    Types: Cigarettes, E-cigarettes  . Smokeless tobacco: Never Used  . Alcohol use No    Allergies:  Allergies  Allergen Reactions  . Nsaids Other (See Comments)    Pt is unable to take this medication  due to kidney disease.      Prescriptions Prior to Admission  Medication Sig Dispense Refill Last Dose  . hydrochlorothiazide (HYDRODIURIL) 25 MG tablet Take 25 mg by mouth daily.   08/05/2016 at Unknown time  . methyldopa (ALDOMET) 500 MG tablet Take 500 mg by mouth 2 (two) times daily.   08/05/2016 at Unknown time  . Prenatal Vit-Fe Fumarate-FA (PRENATAL MULTIVITAMIN) TABS tablet Take 1 tablet by mouth daily.   08/05/2016 at Unknown time  . amoxicillin (AMOXIL) 875 MG tablet Take 1 tablet (875 mg total) by mouth 2 (two) times daily. (Patient not taking: Reported on 08/05/2016) 14 tablet 0 Completed Course at Unknown time  . predniSONE (DELTASONE) 20 MG tablet Two daily with food (Patient not taking: Reported on 08/05/2016) 6 tablet 0 Completed Course at Unknown time   Results for orders placed or performed during the hospital encounter of 08/05/16 (from the past 48 hour(s))  Urinalysis, Routine w reflex microscopic     Status: Abnormal   Collection Time: 08/05/16  9:30 AM  Result Value Ref Range   Color, Urine YELLOW YELLOW  APPearance CLEAR CLEAR   Specific Gravity, Urine 1.012 1.005 - 1.030   pH 6.0 5.0 - 8.0   Glucose, UA NEGATIVE NEGATIVE mg/dL   Hgb urine dipstick NEGATIVE NEGATIVE   Bilirubin Urine NEGATIVE NEGATIVE   Ketones, ur NEGATIVE NEGATIVE mg/dL   Protein, ur >=300 (A) NEGATIVE mg/dL   Nitrite NEGATIVE NEGATIVE   Leukocytes, UA NEGATIVE NEGATIVE   RBC / HPF 0-5 0 - 5 RBC/hpf   WBC, UA 0-5 0 - 5 WBC/hpf   Bacteria, UA RARE (A) NONE SEEN   Squamous Epithelial / LPF 0-5 (A) NONE SEEN   Mucous PRESENT   Protein / creatinine ratio, urine     Status: Abnormal   Collection Time: 08/05/16  9:30 AM  Result Value Ref Range   Creatinine, Urine 97.00 mg/dL   Total Protein, Urine 712 mg/dL    Comment: NO NORMAL RANGE ESTABLISHED FOR THIS TEST RESULTS CONFIRMED BY MANUAL DILUTION    Protein Creatinine Ratio 7.34 (H) 0.00 - 0.15 mg/mg[Cre]  CBC     Status: Abnormal   Collection Time:  08/05/16 10:00 AM  Result Value Ref Range   WBC 9.3 4.0 - 10.5 K/uL   RBC 3.79 (L) 3.87 - 5.11 MIL/uL   Hemoglobin 11.6 (L) 12.0 - 15.0 g/dL   HCT 33.5 (L) 36.0 - 46.0 %   MCV 88.4 78.0 - 100.0 fL   MCH 30.6 26.0 - 34.0 pg   MCHC 34.6 30.0 - 36.0 g/dL   RDW 14.9 11.5 - 15.5 %   Platelets 221 150 - 400 K/uL  Comprehensive metabolic panel     Status: Abnormal   Collection Time: 08/05/16 10:00 AM  Result Value Ref Range   Sodium 134 (L) 135 - 145 mmol/L   Potassium 3.5 3.5 - 5.1 mmol/L   Chloride 108 101 - 111 mmol/L   CO2 19 (L) 22 - 32 mmol/L   Glucose, Bld 85 65 - 99 mg/dL   BUN <5 (L) 6 - 20 mg/dL    Comment: REPEATED TO VERIFY   Creatinine, Ser 0.49 0.44 - 1.00 mg/dL   Calcium 8.2 (L) 8.9 - 10.3 mg/dL   Total Protein 4.8 (L) 6.5 - 8.1 g/dL   Albumin 2.0 (L) 3.5 - 5.0 g/dL   AST 15 15 - 41 U/L   ALT 11 (L) 14 - 54 U/L   Alkaline Phosphatase 112 38 - 126 U/L   Total Bilirubin 0.1 (L) 0.3 - 1.2 mg/dL   GFR calc non Af Amer >60 >60 mL/min   GFR calc Af Amer >60 >60 mL/min    Comment: (NOTE) The eGFR has been calculated using the CKD EPI equation. This calculation has not been validated in all clinical situations. eGFR's persistently <60 mL/min signify possible Chronic Kidney Disease.    Anion gap 7 5 - 15  Fetal fibronectin     Status: None   Collection Time: 08/05/16 11:18 AM  Result Value Ref Range   Fetal Fibronectin NEGATIVE NEGATIVE   No results found.  Review of Systems  Eyes: Negative for photophobia and visual disturbance.  Gastrointestinal: Negative for abdominal pain.  Genitourinary: Negative for vaginal bleeding.  Neurological: Positive for headaches.   Physical Exam   Blood pressure (!) 147/92, pulse 94, temperature 98.2 F (36.8 C), temperature source Oral, resp. rate 18, height 5' 4"  (1.626 m), weight 223 lb 12 oz (101.5 kg), SpO2 98 %.   Patient Vitals for the past 24 hrs:  BP Temp Temp src Pulse Resp  SpO2 Height Weight  08/05/16 1320 121/66 - -  96 18 99 % - -  08/05/16 1315 121/66 - - 92 - 98 % - -  08/05/16 1300 (!) 146/84 - - (!) 101 - 98 % - -  08/05/16 1245 (!) 142/72 - - 92 - 99 % - -  08/05/16 1230 (!) 144/91 - - (!) 102 - 99 % - -  08/05/16 1215 140/82 - - 90 - 99 % - -  08/05/16 1200 (!) 139/98 - - 84 - 97 % - -  08/05/16 1148 (!) 147/92 - - 94 - 98 % - -  08/05/16 1130 (!) 142/93 - - 86 - 98 % - -  08/05/16 1115 (!) 146/91 - - 80 - 100 % - -  08/05/16 1102 (!) 139/93 - - 93 - 97 % - -  08/05/16 1045 (!) 155/101 - - 89 - 96 % - -  08/05/16 1030 (!) 150/90 - - 98 - 96 % - -  08/05/16 1015 (!) 153/96 - - 93 - 97 % - -  08/05/16 1000 (!) 142/103 - - (!) 102 - 95 % - -  08/05/16 0946 (!) 148/98 98.2 F (36.8 C) Oral 100 18 98 % - -  08/05/16 8527 - - - - - - 5' 4"  (1.626 m) 223 lb 12 oz (101.5 kg)    Physical Exam  Constitutional: She is oriented to person, place, and time. She appears well-developed and well-nourished. No distress.  GI: Soft. She exhibits no distension. There is no tenderness. There is no rebound and no guarding.  Genitourinary:  Genitourinary Comments: Dilation: Closed Effacement (%): 90 Presentation: Vertex (verified by [place]) Exam by:: Noni Saupe, NP  Musculoskeletal: Normal range of motion.  Neurological: She is alert and oriented to person, place, and time.  Skin: Skin is warm. She is not diaphoretic.  Psychiatric: Her behavior is normal.   Fetal tracing, reactive, + 15x15 accels on both fetuses, no decels, no contractions.   MAU Course  Procedures  none  MDM  RN called NP to patients room STAT due to fetal bradycardia noted by RN.  Stat Bedside US called. Bedside US reveals both fetal heart rates WNL, without bradycardia. Will continue to monitor.  CBC and CMP & PCR.  Discussed labs with Dr. Melba Coon, will continue fetal monitoring for an additional hour.  HA Pain 0/10 @ 1322 Fetal fibronectin negative.  Per Ob records patients protein level was >7000 in March.  Discussed labs, fetal  tracing with Dr. Melba Coon. Betamethasone given  Assessment and Plan   A:  1. NST (non-stress test) reactive   2. Bradycardia found on measurement of baseline fetal heart rate   3. Back pain affecting pregnancy in third trimester   4. Threatened preterm labor, third trimester   5. Chronic hypertension affecting pregnancy     P:  Discharge home in stable condition Keep your appointment with [facility] tomorrow, keep your office appointment tomorrow. Return to MAU tomorrow for 2nd betamethasone Preterm labor precautions, with strict return precautions to MAU. Preeclampsia precautions.   Lezlie Lye, NP 08/05/2016 2:10 PM

## 2016-08-05 NOTE — Discharge Instructions (Signed)
Preventing Preterm Birth Preterm birth is when your baby is delivered between 72 weeks and 37 weeks of pregnancy. A full-term pregnancy lasts for at least 37 weeks. Preterm birth can be dangerous for your baby because the last few weeks of pregnancy are an important time for your baby's brain and lungs to grow. Many things can cause a baby to be born early. Sometimes the cause is not known. There are certain factors that make you more likely to experience preterm birth, such as:  Having a previous baby born preterm.  Being pregnant with twins or other multiples.  Having had fertility treatment.  Being overweight or underweight at the start of your pregnancy.  Having any of the following during pregnancy:  An infection, including a urinary tract infection (UTI) or an STI (sexually transmitted infection).  High blood pressure.  Diabetes.  Vaginal bleeding.  Being age 12 or older.  Being age 81 or younger.  Getting pregnant within 6 months of a previous pregnancy.  Suffering extreme stress or physical or emotional abuse during pregnancy.  Standing for long periods of time during pregnancy, such as working at a job that requires standing. What are the risks? The most serious risk of preterm birth is that the baby may not survive. This is more likely to happen if a baby is born before 71 weeks. Other risks and complications of preterm birth may include your baby having:  Breathing problems.  Brain damage that affects movement and coordination (cerebral palsy).  Feeding difficulties.  Vision or hearing problems.  Infections or inflammation of the digestive tract (colitis).  Developmental delays.  Learning disabilities.  Higher risk for diabetes, heart disease, and high blood pressure later in life. What can I do to lower my risk? Medical care  The most important thing you can do to lower your risk for preterm birth is to get routine medical care during pregnancy (prenatal  care). If you have a high risk of preterm birth, you may be referred to a health care provider who specializes in managing high-risk pregnancies (perinatologist). You may be given medicine to help prevent preterm birth. Lifestyle changes  Certain lifestyle changes can also lower your risk of preterm birth:  Wait at least 6 months after a pregnancy to become pregnant again.  Try to plan pregnancy for when you are between 51 and 18 years old.  Get to a healthy weight before getting pregnant. If you are overweight, work with your health care provider to safely lose weight.  Do not use any products that contain nicotine or tobacco, such as cigarettes and e-cigarettes. If you need help quitting, ask your health care provider.  Do not drink alcohol.  Do not use drugs. Where to find support: For more support, consider:  Talking with your health care provider.  Talking with a therapist or substance abuse counselor, if you need help quitting.  Working with a diet and nutrition specialist (dietitian) or a Physiological scientist to maintain a healthy weight.  Joining a support group. Where to find more information: Learn more about preventing preterm birth from:  Centers for Disease Control and Prevention: VoipObserver.com.br  March of Dimes: marchofdimes.org/complications/premature-babies.aspx  American Pregnancy Association: americanpregnancy.org/labor-and-birth/premature-labor Contact a health care provider if:  You have any of the following signs of preterm labor before 37 weeks:  A change or increase in vaginal discharge.  Fluid leaking from your vagina.  Pressure or cramps in your lower abdomen.  A backache that does not go away or gets  worse.  Regular tightening (contractions) in your lower abdomen. Summary  Preterm birth means having your baby during weeks 58-37 of pregnancy.  Preterm birth may put your baby at risk for physical  and mental problems.  Getting good prenatal care can help prevent preterm birth.  You can lower your risk of preterm birth by making certain lifestyle changes, such as not smoking and not using alcohol. This information is not intended to replace advice given to you by your health care provider. Make sure you discuss any questions you have with your health care provider. Document Released: 06/06/2015 Document Revised: 12/30/2015 Document Reviewed: 12/30/2015 Elsevier Interactive Patient Education  2017 [employer]. Back Pain in Pregnancy Back pain during pregnancy is common. Back pain may be caused by several factors that are related to changes during your pregnancy. Follow these instructions at home: Managing pain, stiffness, and swelling   If directed, apply ice for sudden (acute) back pain.  Put ice in a plastic bag.  Place a towel between your skin and the bag.  Leave the ice on for 20 minutes, 2-3 times per day.  If directed, apply heat to the affected area before you exercise:  Place a towel between your skin and the heat pack or heating pad.  Leave the heat on for 20-30 minutes.  Remove the heat if your skin turns bright red. This is especially important if you are unable to feel pain, heat, or cold. You may have a greater risk of getting burned. Activity   Exercise as told by your health care provider. Exercising is the best way to prevent or manage back pain.  Listen to your body when lifting. If lifting hurts, ask for help or bend your knees. This uses your leg muscles instead of your back muscles.  Squat down when picking up something from the floor. Do not bend over.  Only use bed rest as told by your health care provider. Bed rest should only be used for the most severe episodes of back pain. Standing, Sitting, and Lying Down   Do not stand in one place for long periods of time.  Use good posture when sitting. Make sure your head rests over your shoulders and  is not hanging forward. Use a pillow on your lower back if necessary.  Try sleeping on your side, preferably the left side, with a pillow or two between your legs. If you are sore after a night's rest, your bed may be too soft. A firm mattress may provide more support for your back during pregnancy. General instructions   Do not wear high heels.  Eat a healthy diet. Try to gain weight within your health care provider's recommendations.  Use a maternity girdle, elastic sling, or back brace as told by your health care provider.  Take over-the-counter and prescription medicines only as told by your health care provider.  Keep all follow-up visits as told by your health care provider. This is important. This includes any visits with any specialists, such as a physical therapist. Contact a health care provider if:  Your back pain interferes with your daily activities.  You have increasing pain in other parts of your body. Get help right away if:  You develop numbness, tingling, weakness, or problems with the use of your arms or legs.  You develop severe back pain that is not controlled with medicine.  You have a sudden change in bowel or bladder control.  You develop shortness of breath, dizziness, or you faint.  You develop nausea, vomiting, or sweating.  You have back pain that is a rhythmic, cramping pain similar to labor pains. Labor pain is usually 1-2 minutes apart, lasts for about 1 minute, and involves a bearing down feeling or pressure in your pelvis.  You have back pain and your water breaks or you have vaginal bleeding.  You have back pain or numbness that travels down your leg.  Your back pain developed after you fell.  You develop pain on one side of your back.  You see blood in your urine.  You develop skin blisters in the area of your back pain. This information is not intended to replace advice given to you by your health care provider. Make sure you discuss any  questions you have with your health care provider. Document Released: 07/31/2005 Document Revised: 09/28/2015 Document Reviewed: 01/04/2015 Elsevier Interactive Patient Education  2017 [employer].

## 2016-08-05 NOTE — Progress Notes (Signed)
Late Entry: Attempting to obtain [place] with confusion between baby A and B.  FHT on baby A appeared to be in 70s-80s so Noni Saupe, NP called to bedside.  Pt position moved and still unable to assess FHT.  STAT bedside ordered and IV started.  Able to get FHT by [place].  Provider also aware of elevated BP.  [place] labs in process.

## 2016-08-05 NOTE — MAU Note (Addendum)
Patient presents to MAU with lower back pain that started late last night and is unrelieved with 500mg  of Tylenol.  Denies LOF or vaginal bleeding.  States she is borderline preeclamptic and currently has a headache.  Twin gestation.  +fetal movement with baby B, but hasn't felt Baby A move today.

## 2016-08-06 ENCOUNTER — Inpatient Hospital Stay (HOSPITAL_COMMUNITY)
Admission: AD | Admit: 2016-08-06 | Discharge: 2016-08-06 | Disposition: A | Discharge status: Home / Self Care | Payer: Medicaid Other | Source: Ambulatory Visit | Attending: Obstetrics and Gynecology | Admitting: Obstetrics and Gynecology

## 2016-08-06 DIAGNOSIS — Z3A31 31 weeks gestation of pregnancy: Secondary | ICD-10-CM | POA: Insufficient documentation

## 2016-08-06 DIAGNOSIS — O26893 Other specified pregnancy related conditions, third trimester: Secondary | ICD-10-CM | POA: Insufficient documentation

## 2016-08-06 DIAGNOSIS — M545 Low back pain: Secondary | ICD-10-CM | POA: Insufficient documentation

## 2016-08-06 MED ORDER — BETAMETHASONE SOD PHOS & ACET 6 (3-3) MG/ML IJ SUSP
12.0000 mg | Freq: Once | INTRAMUSCULAR | Status: AC
  Administered 2016-08-06: 12 mg via INTRAMUSCULAR
  Filled 2016-08-06: qty 2

## 2016-08-06 NOTE — MAU Provider Note (Signed)
S:  Ms. MADGELINE RAYO is a 21 y.o. female G1P0 @ [redacted]w[redacted]d with di/di twins here in MAU for her second dose of BMZ. She was seen yesterday in MAU, by myself with back pain. Her pain today is the same. The pain has not worsened. She says she feels a little better. BMZ # 2 given. She denies leaking of fluid or vaginal bleeding.    O:  GENERAL: Well-developed, well-nourished female in no acute distress.  LUNGS: Effort normal SKIN: Warm, dry and without erythema PSYCH: Normal mood and affect  Vitals:   08/06/16 1223  BP: 133/77  Pulse: (!) 102  Resp: 18  Temp: 98.5 F (36.9 C)  Weight: 228 lb (103.4 kg)  Height: 5\' 4"  (1.626 m)    MDM:  Discussed with Dr. Terri Piedra. Patient is comfortable today without worsening of back pain. Patient is scheduled later this week in the office. Strict return precautions given.   Lezlie Lye, NP 08/06/2016 1:40 PM

## 2016-08-06 NOTE — MAU Note (Signed)
Here for 2nd BMZ inj. States still having lower back pain like yesterday and no different. Denies LOF or bleeding.

## 2016-08-16 ENCOUNTER — Inpatient Hospital Stay (HOSPITAL_COMMUNITY)
Admission: AD | Admit: 2016-08-16 | Discharge: 2016-08-16 | Disposition: A | Discharge status: Home / Self Care | Payer: Medicaid Other | Source: Ambulatory Visit | Attending: Obstetrics and Gynecology | Admitting: Obstetrics and Gynecology

## 2016-08-16 ENCOUNTER — Encounter (HOSPITAL_COMMUNITY): Payer: Self-pay

## 2016-08-16 DIAGNOSIS — O1213 Gestational proteinuria, third trimester: Secondary | ICD-10-CM | POA: Insufficient documentation

## 2016-08-16 DIAGNOSIS — R801 Persistent proteinuria, unspecified: Secondary | ICD-10-CM

## 2016-08-16 DIAGNOSIS — Z3A33 33 weeks gestation of pregnancy: Secondary | ICD-10-CM | POA: Insufficient documentation

## 2016-08-16 DIAGNOSIS — O30043 Twin pregnancy, dichorionic/diamniotic, third trimester: Secondary | ICD-10-CM | POA: Diagnosis present

## 2016-08-16 DIAGNOSIS — O163 Unspecified maternal hypertension, third trimester: Secondary | ICD-10-CM | POA: Diagnosis present

## 2016-08-16 DIAGNOSIS — O10913 Unspecified pre-existing hypertension complicating pregnancy, third trimester: Secondary | ICD-10-CM | POA: Diagnosis not present

## 2016-08-16 DIAGNOSIS — O99333 Smoking (tobacco) complicating pregnancy, third trimester: Secondary | ICD-10-CM | POA: Insufficient documentation

## 2016-08-16 DIAGNOSIS — F17210 Nicotine dependence, cigarettes, uncomplicated: Secondary | ICD-10-CM | POA: Diagnosis not present

## 2016-08-16 DIAGNOSIS — Z3689 Encounter for other specified antenatal screening: Secondary | ICD-10-CM

## 2016-08-16 LAB — CBC WITH DIFFERENTIAL/PLATELET
BASOS ABS: 0 10*3/uL (ref 0.0–0.1)
Basophils Relative: 0 %
Eosinophils Absolute: 0.1 10*3/uL (ref 0.0–0.7)
Eosinophils Relative: 1 %
HCT: 35.5 % — ABNORMAL LOW (ref 36.0–46.0)
HEMOGLOBIN: 12.2 g/dL (ref 12.0–15.0)
LYMPHS ABS: 2.7 10*3/uL (ref 0.7–4.0)
LYMPHS PCT: 22 %
MCH: 30.3 pg (ref 26.0–34.0)
MCHC: 34.4 g/dL (ref 30.0–36.0)
MCV: 88.1 fL (ref 78.0–100.0)
Monocytes Absolute: 0.3 10*3/uL (ref 0.1–1.0)
Monocytes Relative: 3 %
NEUTROS ABS: 9.3 10*3/uL — AB (ref 1.7–7.7)
NEUTROS PCT: 74 %
PLATELETS: 245 10*3/uL (ref 150–400)
RBC: 4.03 MIL/uL (ref 3.87–5.11)
RDW: 14.4 % (ref 11.5–15.5)
WBC: 12.4 10*3/uL — AB (ref 4.0–10.5)

## 2016-08-16 LAB — PROTEIN / CREATININE RATIO, URINE
Creatinine, Urine: 37 mg/dL
Protein Creatinine Ratio: 6.51 mg/mg{Cre} — ABNORMAL HIGH (ref 0.00–0.15)
Total Protein, Urine: 241 mg/dL

## 2016-08-16 LAB — URINALYSIS, ROUTINE W REFLEX MICROSCOPIC
Bilirubin Urine: NEGATIVE
Glucose, UA: NEGATIVE mg/dL
Hgb urine dipstick: NEGATIVE
Ketones, ur: NEGATIVE mg/dL
NITRITE: NEGATIVE
SPECIFIC GRAVITY, URINE: 1.008 (ref 1.005–1.030)
pH: 7 (ref 5.0–8.0)

## 2016-08-16 LAB — COMPREHENSIVE METABOLIC PANEL
ALK PHOS: 133 U/L — AB (ref 38–126)
ALT: 11 U/L — AB (ref 14–54)
AST: 14 U/L — ABNORMAL LOW (ref 15–41)
Albumin: 2.2 g/dL — ABNORMAL LOW (ref 3.5–5.0)
Anion gap: 7 (ref 5–15)
BUN: 5 mg/dL — AB (ref 6–20)
CHLORIDE: 107 mmol/L (ref 101–111)
CO2: 20 mmol/L — AB (ref 22–32)
CREATININE: 0.39 mg/dL — AB (ref 0.44–1.00)
Calcium: 8.5 mg/dL — ABNORMAL LOW (ref 8.9–10.3)
Glucose, Bld: 83 mg/dL (ref 65–99)
Potassium: 3.9 mmol/L (ref 3.5–5.1)
SODIUM: 134 mmol/L — AB (ref 135–145)
Total Bilirubin: 0.3 mg/dL (ref 0.3–1.2)
Total Protein: 5.5 g/dL — ABNORMAL LOW (ref 6.5–8.1)

## 2016-08-16 NOTE — Discharge Instructions (Signed)
Hypertension During Pregnancy °Hypertension, commonly called high blood pressure, is when the force of blood pumping through your arteries is too strong. Arteries are blood vessels that carry blood from the heart throughout the body. Hypertension during pregnancy can cause problems for you and your baby. Your baby may be born early (prematurely) or may not weigh as much as he or she should at birth. Very bad cases of hypertension during pregnancy can be life-threatening. °Different types of hypertension can occur during pregnancy. These include: °· Chronic hypertension. This happens when: °¨ You have hypertension before pregnancy and it continues during pregnancy. °¨ You develop hypertension before you are [redacted] weeks pregnant, and it continues during pregnancy. °· Gestational hypertension. This is hypertension that develops after the 20th week of pregnancy. °· Preeclampsia, also called toxemia of pregnancy. This is a very serious type of hypertension that develops only during pregnancy. It affects the whole body, and it can be very dangerous for you and your baby. °Gestational hypertension and preeclampsia usually go away within 6 weeks after your baby is born. Women who have hypertension during pregnancy have a greater chance of developing hypertension later in life or during future pregnancies. °What are the causes? °The exact cause of hypertension is not known. °What increases the risk? °There are certain factors that make it more likely for you to develop hypertension during pregnancy. These include: °· Having hypertension during a previous pregnancy or prior to pregnancy. °· Being overweight. °· Being older than age 40. °· Being pregnant for the first time or being pregnant with more than one baby. °· Becoming pregnant using fertilization methods such as IVF (in vitro fertilization). °· Having diabetes, kidney problems, or systemic lupus erythematosus. °· Having a family history of hypertension. °What are the  signs or symptoms? °Chronic hypertension and gestational hypertension rarely cause symptoms. Preeclampsia causes symptoms, which may include: °· Increased protein in your urine. Your health care provider will check for this at every visit before you give birth (prenatal visit). °· Severe headaches. °· Sudden weight gain. °· Swelling of the hands, face, legs, and feet. °· Nausea and vomiting. °· Vision problems, such as blurred or double vision. °· Numbness in the face, arms, legs, and feet. °· Dizziness. °· Slurred speech. °· Sensitivity to bright lights. °· Abdominal pain. °· Convulsions. °How is this diagnosed? °You may be diagnosed with hypertension during a routine prenatal exam. At each prenatal visit, you may: °· Have a urine test to check for high amounts of protein in your urine. °· Have your blood pressure checked. A blood pressure reading is recorded as two numbers, such as "120 over 80" (or 120/80). The first ("top") number is called the systolic pressure. It is a measure of the pressure in your arteries when your heart beats. The second ("bottom") number is called the diastolic pressure. It is a measure of the pressure in your arteries as your heart relaxes between beats. Blood pressure is measured in a unit called mm Hg. A normal blood pressure reading is: °¨ Systolic: below 120. °¨ Diastolic: below 80. °The type of hypertension that you are diagnosed with depends on your test results and when your symptoms developed. °· Chronic hypertension is usually diagnosed before 20 weeks of pregnancy. °· Gestational hypertension is usually diagnosed after 20 weeks of pregnancy. °· Hypertension with high amounts of protein in the urine is diagnosed as preeclampsia. °· Blood pressure measurements that stay above 160 systolic, or above 110 diastolic, are signs of severe preeclampsia. °  How is this treated? Treatment for hypertension during pregnancy varies depending on the type of hypertension you have and how  serious it is.  If you take medicines called ACE inhibitors to treat chronic hypertension, you may need to switch medicines. ACE inhibitors should not be taken during pregnancy.  If you have gestational hypertension, you may need to take blood pressure medicine.  If you are at risk for preeclampsia, your health care provider may recommend that you take a low-dose aspirin every day to prevent high blood pressure during your pregnancy.  If you have severe preeclampsia, you may need to be hospitalized so you and your baby can be monitored closely. You may also need to take medicine (magnesium sulfate) to prevent seizures and to lower blood pressure. This medicine may be given as an injection or through an IV tube.  In some cases, if your condition gets worse, you may need to deliver your baby early. Follow these instructions at home: Eating and drinking   Drink enough fluid to keep your urine clear or pale yellow.  Eat a healthy diet that is low in salt (sodium). Do not add salt to your food. Check food labels to see how much sodium a food or beverage contains. Lifestyle   Do not use any products that contain nicotine or tobacco, such as cigarettes and e-cigarettes. If you need help quitting, ask your health care provider.  Do not use alcohol.  Avoid caffeine.  Avoid stress as much as possible. Rest and get plenty of sleep. General instructions   Take over-the-counter and prescription medicines only as told by your health care provider.  While lying down, lie on your left side. This keeps pressure off your baby.  While sitting or lying down, raise (elevate) your feet. Try putting some pillows under your lower legs.  Exercise regularly. Ask your health care provider what kinds of exercise are best for you.  Keep all prenatal and follow-up visits as told by your health care provider. This is important. Contact a health care provider if:  You have symptoms that your health care  provider told you may require more treatment or monitoring, such as:  Fever.  Vomiting.  Headache. Get help right away if:  You have severe abdominal pain or vomiting that does not get better with treatment.  You suddenly develop swelling in your hands, ankles, or face.  You gain 4 lbs (1.8 kg) or more in 1 week.  You develop vaginal bleeding, or you have blood in your urine.  You do not feel your baby moving as much as usual.  You have blurred or double vision.  You have muscle twitching or sudden tightening (spasms).  You have shortness of breath.  Your lips or fingernails turn blue. This information is not intended to replace advice given to you by your health care provider. Make sure you discuss any questions you have with your health care provider. Document Released: 01/08/2011 Document Revised: 11/10/2015 Document Reviewed: 10/06/2015 Elsevier Interactive Patient Education  2017 [employer].     Fetal Movement Counts Patient Name: ________________________________________________ Patient Due Date: ____________________ What is a fetal movement count? A fetal movement count is the number of times that you feel your baby move during a certain amount of time. This may also be called a fetal kick count. A fetal movement count is recommended for every pregnant woman. You may be asked to start counting fetal movements as early as week 28 of your pregnancy. Pay attention to  when your baby is most active. You may notice your baby's sleep and wake cycles. You may also notice things that make your baby move more. You should do a fetal movement count:  When your baby is normally most active.  At the same time each day. A good time to count movements is while you are resting, after having something to eat and drink. How do I count fetal movements? 1. Find a quiet, comfortable area. Sit, or lie down on your side. 2. Write down the date, the start time and stop time, and the  number of movements that you felt between those two times. Take this information with you to your health care visits. 3. For 2 hours, count kicks, flutters, swishes, rolls, and jabs. You should feel at least 10 movements during 2 hours. 4. You may stop counting after you have felt 10 movements. 5. If you do not feel 10 movements in 2 hours, have something to eat and drink. Then, keep resting and counting for 1 hour. If you feel at least 4 movements during that hour, you may stop counting. Contact a health care provider if:  You feel fewer than 4 movements in 2 hours.  Your baby is not moving like he or she usually does. Date: ____________ Start time: ____________ Stop time: ____________ Movements: ____________ Date: ____________ Start time: ____________ Stop time: ____________ Movements: ____________ Date: ____________ Start time: ____________ Stop time: ____________ Movements: ____________ Date: ____________ Start time: ____________ Stop time: ____________ Movements: ____________ Date: ____________ Start time: ____________ Stop time: ____________ Movements: ____________ Date: ____________ Start time: ____________ Stop time: ____________ Movements: ____________ Date: ____________ Start time: ____________ Stop time: ____________ Movements: ____________ Date: ____________ Start time: ____________ Stop time: ____________ Movements: ____________ Date: ____________ Start time: ____________ Stop time: ____________ Movements: ____________ This information is not intended to replace advice given to you by your health care provider. Make sure you discuss any questions you have with your health care provider. Document Released: 05/22/2006 Document Revised: 12/20/2015 Document Reviewed: 06/01/2015 Elsevier Interactive Patient Education  2017 [employer].

## 2016-08-16 NOTE — MAU Provider Note (Signed)
History     CSN: [id number]  Arrival date and time: 08/16/16 1107   First Provider Initiated Contact with Patient 08/16/16 1242    Chief Complaint  Patient presents with  . Non-stress Test  . Hypertension   HPI Carly Taylor is a 21 y.o. G1P0 at 32w2dwith di/di twins who presents from office for fetal monitoring & BP evaluation. Pt has chronic hypertension & is on HCTZ & methyldopa. In office today had BP 150s/90s & baby B "wouldn't trace".  Denies headache, visual disturbance, epigastric pain. Positive fetal movement.   OB History    Gravida Para Term Preterm AB Living   1             SAB TAB Ectopic Multiple Live Births                  Past Medical History:  Diagnosis Date  . Hypertension    on meds  . IgA nephropathy   . Infection    UTI  . Kidney disease    "C1Q"  . Ovarian cyst     Past Surgical History:  Procedure Laterality Date  . APPENDECTOMY     2008    Family History  Problem Relation Age of Onset  . Heart disease Father   . Stroke Father   . Kidney disease Paternal Aunt   . Hypertension Maternal Grandmother   . Diabetes Maternal Grandmother   . Hypertension Maternal Grandfather   . Hypertension Paternal Grandmother   . Heart disease Paternal Grandmother   . Hypertension Paternal Grandfather   . Heart disease Paternal Grandfather     Social History  Substance Use Topics  . Smoking status: Current Every Day Smoker    Packs/day: 0.25    Years: 1.00    Types: Cigarettes, E-cigarettes  . Smokeless tobacco: Never Used  . Alcohol use No    Allergies:  Allergies  Allergen Reactions  . Nsaids Other (See Comments)    Pt is unable to take this medication due to kidney disease.      Prescriptions Prior to Admission  Medication Sig Dispense Refill Last Dose  . amoxicillin (AMOXIL) 875 MG tablet Take 1 tablet (875 mg total) by mouth 2 (two) times daily. (Patient not taking: Reported on 08/05/2016) 14 tablet 0 Completed Course at Unknown  time  . hydrochlorothiazide (HYDRODIURIL) 25 MG tablet Take 25 mg by mouth daily.   08/05/2016 at Unknown time  . methyldopa (ALDOMET) 500 MG tablet Take 500 mg by mouth 2 (two) times daily.   08/05/2016 at Unknown time  . predniSONE (DELTASONE) 20 MG tablet Two daily with food (Patient not taking: Reported on 08/05/2016) 6 tablet 0 Completed Course at Unknown time  . Prenatal Vit-Fe Fumarate-FA (PRENATAL MULTIVITAMIN) TABS tablet Take 1 tablet by mouth daily.   08/05/2016 at Unknown time    Review of Systems  Constitutional: Negative.   Eyes: Negative for visual disturbance.  Gastrointestinal: Negative.   Genitourinary: Negative.   Neurological: Negative for headaches.   Physical Exam   Blood pressure (!) 139/92, pulse (!) 111, temperature 98.7 F (37.1 C), temperature source Oral, resp. rate 18, SpO2 99 %.  Temp:  [98.7 F (37.1 C)] 98.7 F (37.1 C) (04/13 1148) Pulse Rate:  [98-114] 111 (04/13 1300) Resp:  [18] 18 (04/13 1148) BP: (139-149)/(92-100) 139/92 (04/13 1300) SpO2:  [98 %-99 %] 99 % (04/13 1300)   Physical Exam  Nursing note and vitals reviewed. Constitutional: She is oriented to person,  place, and time. She appears well-developed and well-nourished. No distress.  HENT:  Head: Normocephalic and atraumatic.  Eyes: Conjunctivae are normal. Right eye exhibits no discharge. Left eye exhibits no discharge. No scleral icterus.  Neck: Normal range of motion.  Cardiovascular: Normal rate, regular rhythm and normal heart sounds.   No murmur heard. Respiratory: Effort normal and breath sounds normal. No respiratory distress. She has no wheezes.  GI: Soft. There is no tenderness.  Neurological: She is alert and oriented to person, place, and time. She has normal reflexes.  No clonus  Skin: Skin is warm and dry. She is not diaphoretic.  Psychiatric: She has a normal mood and affect. Her behavior is normal. Judgment and thought content normal.   Fetal Tracing: Baby A Baseline:  140 Variability: moderate Accelerations: 15x15 Decelerations: none  Baby B Baseline: 135 Variability: moderate Accelerations: 15x15 Decelerations: none  Toco: x2 MAU Course  Procedures Results for orders placed or performed during the hospital encounter of 08/16/16 (from the past 48 hour(s))  Urinalysis, Routine w reflex microscopic     Status: Abnormal   Collection Time: 08/16/16 11:35 AM  Result Value Ref Range   Color, Urine YELLOW YELLOW   APPearance HAZY (A) CLEAR   Specific Gravity, Urine 1.008 1.005 - 1.030   pH 7.0 5.0 - 8.0   Glucose, UA NEGATIVE NEGATIVE mg/dL   Hgb urine dipstick NEGATIVE NEGATIVE   Bilirubin Urine NEGATIVE NEGATIVE   Ketones, ur NEGATIVE NEGATIVE mg/dL   Protein, ur >=300 (A) NEGATIVE mg/dL   Nitrite NEGATIVE NEGATIVE   Leukocytes, UA TRACE (A) NEGATIVE   RBC / HPF 0-5 0 - 5 RBC/hpf   WBC, UA 6-30 0 - 5 WBC/hpf   Bacteria, UA RARE (A) NONE SEEN   Squamous Epithelial / LPF 6-30 (A) NONE SEEN   Mucous PRESENT   Protein / creatinine ratio, urine     Status: Abnormal   Collection Time: 08/16/16 11:35 AM  Result Value Ref Range   Creatinine, Urine 37.00 mg/dL   Total Protein, Urine 241 mg/dL    Comment: NO NORMAL RANGE ESTABLISHED FOR THIS TEST RESULTS CONFIRMED BY MANUAL DILUTION    Protein Creatinine Ratio 6.51 (H) 0.00 - 0.15 mg/mg[Cre]  CBC with Differential     Status: Abnormal   Collection Time: 08/16/16 12:32 PM  Result Value Ref Range   WBC 12.4 (H) 4.0 - 10.5 K/uL   RBC 4.03 3.87 - 5.11 MIL/uL   Hemoglobin 12.2 12.0 - 15.0 g/dL   HCT 35.5 (L) 36.0 - 46.0 %   MCV 88.1 78.0 - 100.0 fL   MCH 30.3 26.0 - 34.0 pg   MCHC 34.4 30.0 - 36.0 g/dL   RDW 14.4 11.5 - 15.5 %   Platelets 245 150 - 400 K/uL   Neutrophils Relative % 74 %   Neutro Abs 9.3 (H) 1.7 - 7.7 K/uL   Lymphocytes Relative 22 %   Lymphs Abs 2.7 0.7 - 4.0 K/uL   Monocytes Relative 3 %   Monocytes Absolute 0.3 0.1 - 1.0 K/uL   Eosinophils Relative 1 %   Eosinophils  Absolute 0.1 0.0 - 0.7 K/uL   Basophils Relative 0 %   Basophils Absolute 0.0 0.0 - 0.1 K/uL  Comprehensive metabolic panel     Status: Abnormal   Collection Time: 08/16/16 12:32 PM  Result Value Ref Range   Sodium 134 (L) 135 - 145 mmol/L   Potassium 3.9 3.5 - 5.1 mmol/L   Chloride 107 101 -  111 mmol/L   CO2 20 (L) 22 - 32 mmol/L   Glucose, Bld 83 65 - 99 mg/dL   BUN 5 (L) 6 - 20 mg/dL   Creatinine, Ser 0.39 (L) 0.44 - 1.00 mg/dL   Calcium 8.5 (L) 8.9 - 10.3 mg/dL   Total Protein 5.5 (L) 6.5 - 8.1 g/dL   Albumin 2.2 (L) 3.5 - 5.0 g/dL   AST 14 (L) 15 - 41 U/L   ALT 11 (L) 14 - 54 U/L   Alkaline Phosphatase 133 (H) 38 - 126 U/L   Total Bilirubin 0.3 0.3 - 1.2 mg/dL   GFR calc non Af Amer >60 >60 mL/min   GFR calc Af Amer >60 >60 mL/min    Comment: (NOTE) The eGFR has been calculated using the CKD EPI equation. This calculation has not been validated in all clinical situations. eGFR's persistently <60 mL/min signify possible Chronic Kidney Disease.    Anion gap 7 5 - 15    MDM CBC, CMP, urine PCR Reactive NST No severe range BPs Urine PCR elevated -- pt has nephropathy with persistent proteinuria-- otherwise PIH labs WNL S/w Dr. Melba Coon. Ok to discharge home  Assessment and Plan  A: 1. Maternal chronic hypertension in third trimester   2. Persistent proteinuria   3. Dichorionic diamniotic twin pregnancy in third trimester   4. NST (non-stress test) reactive    P: Discharge home Discussed reasons to return to MAU Keep f/u with OB  Jorje Guild 08/16/2016, 12:41 PM

## 2016-08-16 NOTE — MAU Note (Signed)
Urine in lab 

## 2016-08-16 NOTE — Progress Notes (Addendum)
Pt is G1 @ 33+[redacted] wksga with DiDi twins. Sent from [facility] clinic due to high bp and on meds for them. States took [employer] and hydrochlorothiazide. Denies LOF or bleeding.  Orders for NST and labs; CBC, CMP. From office. Urine collected.   1146: efm applied.   1325: pending urine total protein creatine  1347: provider at bs going over [place]   1350: Discharge instructions given with pt understanding. Pt left unit via ambulatory with SO

## 2016-08-17 ENCOUNTER — Emergency Department (HOSPITAL_COMMUNITY): Payer: No Typology Code available for payment source

## 2016-08-17 ENCOUNTER — Observation Stay (HOSPITAL_COMMUNITY)
Admission: AD | Admit: 2016-08-17 | Discharge: 2016-08-18 | Disposition: A | Discharge status: Home / Self Care | Payer: No Typology Code available for payment source | Attending: Obstetrics and Gynecology | Admitting: Obstetrics and Gynecology

## 2016-08-17 ENCOUNTER — Encounter (HOSPITAL_COMMUNITY): Payer: Self-pay | Admitting: Emergency Medicine

## 2016-08-17 DIAGNOSIS — O163 Unspecified maternal hypertension, third trimester: Secondary | ICD-10-CM | POA: Diagnosis not present

## 2016-08-17 DIAGNOSIS — O4700 False labor before 37 completed weeks of gestation, unspecified trimester: Secondary | ICD-10-CM

## 2016-08-17 DIAGNOSIS — Z3A33 33 weeks gestation of pregnancy: Secondary | ICD-10-CM | POA: Diagnosis not present

## 2016-08-17 DIAGNOSIS — O479 False labor, unspecified: Secondary | ICD-10-CM | POA: Diagnosis present

## 2016-08-17 DIAGNOSIS — O9A213 Injury, poisoning and certain other consequences of external causes complicating pregnancy, third trimester: Secondary | ICD-10-CM | POA: Diagnosis not present

## 2016-08-17 DIAGNOSIS — N289 Disorder of kidney and ureter, unspecified: Secondary | ICD-10-CM | POA: Insufficient documentation

## 2016-08-17 DIAGNOSIS — O26833 Pregnancy related renal disease, third trimester: Secondary | ICD-10-CM | POA: Diagnosis not present

## 2016-08-17 DIAGNOSIS — R1030 Lower abdominal pain, unspecified: Secondary | ICD-10-CM | POA: Diagnosis not present

## 2016-08-17 DIAGNOSIS — Z3493 Encounter for supervision of normal pregnancy, unspecified, third trimester: Secondary | ICD-10-CM

## 2016-08-17 LAB — COMPREHENSIVE METABOLIC PANEL
ALBUMIN: 2.2 g/dL — AB (ref 3.5–5.0)
ALT: 12 U/L — AB (ref 14–54)
AST: 21 U/L (ref 15–41)
Alkaline Phosphatase: 146 U/L — ABNORMAL HIGH (ref 38–126)
Anion gap: 12 (ref 5–15)
BUN: 6 mg/dL (ref 6–20)
CALCIUM: 8.7 mg/dL — AB (ref 8.9–10.3)
CO2: 18 mmol/L — AB (ref 22–32)
CREATININE: 0.65 mg/dL (ref 0.44–1.00)
Chloride: 105 mmol/L (ref 101–111)
GFR calc Af Amer: >60 mL/min (ref >60)
GFR calc non Af Amer: >60 mL/min (ref >60)
GLUCOSE: 94 mg/dL (ref 65–99)
Potassium: 3.9 mmol/L (ref 3.5–5.1)
SODIUM: 135 mmol/L (ref 135–145)
Total Bilirubin: 0.6 mg/dL (ref 0.3–1.2)
Total Protein: 5.1 g/dL — ABNORMAL LOW (ref 6.5–8.1)

## 2016-08-17 LAB — PROTIME-INR
INR: 0.93
PROTHROMBIN TIME: 12.4 s (ref 11.4–15.2)

## 2016-08-17 LAB — SAMPLE TO BLOOD BANK

## 2016-08-17 LAB — CBC
HCT: 36.8 % (ref 36.0–46.0)
Hemoglobin: 12.5 g/dL (ref 12.0–15.0)
MCH: 29.9 pg (ref 26.0–34.0)
MCHC: 34 g/dL (ref 30.0–36.0)
MCV: 88 fL (ref 78.0–100.0)
PLATELETS: 269 10*3/uL (ref 150–400)
RBC: 4.18 MIL/uL (ref 3.87–5.11)
RDW: 14.5 % (ref 11.5–15.5)
WBC: 14.9 10*3/uL — ABNORMAL HIGH (ref 4.0–10.5)

## 2016-08-17 LAB — I-STAT CHEM 8, ED
BUN: 6 mg/dL (ref 6–20)
CHLORIDE: 108 mmol/L (ref 101–111)
Calcium, Ion: 1.13 mmol/L — ABNORMAL LOW (ref 1.15–1.40)
Creatinine, Ser: 0.6 mg/dL (ref 0.44–1.00)
Glucose, Bld: 93 mg/dL (ref 65–99)
HCT: 35 % — ABNORMAL LOW (ref 36.0–46.0)
Hemoglobin: 11.9 g/dL — ABNORMAL LOW (ref 12.0–15.0)
POTASSIUM: 3.9 mmol/L (ref 3.5–5.1)
Sodium: 137 mmol/L (ref 135–145)
TCO2: 21 mmol/L (ref 0–100)

## 2016-08-17 LAB — ETHANOL

## 2016-08-17 LAB — I-STAT CG4 LACTIC ACID, ED: Lactic Acid, Venous: 1.47 mmol/L (ref 0.5–1.9)

## 2016-08-17 MED ORDER — LACTATED RINGERS IV SOLN
INTRAVENOUS | Status: DC
  Administered 2016-08-18 (×2): via INTRAVENOUS

## 2016-08-17 MED ORDER — LIDOCAINE-EPINEPHRINE (PF) 2 %-1:200000 IJ SOLN
20.0000 mL | Freq: Once | INTRAMUSCULAR | Status: AC
  Administered 2016-08-17: 20 mL

## 2016-08-17 MED ORDER — LACTATED RINGERS IV BOLUS (SEPSIS)
1000.0000 mL | Freq: Once | INTRAVENOUS | Status: AC
  Administered 2016-08-17: 1000 mL via INTRAVENOUS

## 2016-08-17 MED ORDER — SODIUM CHLORIDE 0.9 % IV BOLUS (SEPSIS)
1000.0000 mL | Freq: Once | INTRAVENOUS | Status: DC
Start: 2016-08-17 — End: 2016-08-17

## 2016-08-17 MED ORDER — LACTATED RINGERS IV BOLUS (SEPSIS)
500.0000 mL | Freq: Once | INTRAVENOUS | Status: AC
  Administered 2016-08-18: 500 mL via INTRAVENOUS

## 2016-08-17 NOTE — Progress Notes (Signed)
   08/17/16 2050  Clinical Encounter Type  Visited With Patient and family together  Visit Type ED  Spiritual Encounters  Spiritual Needs Emotional  Stress Factors  Patient Stress Factors Health changes  Family Stress Factors Family relationships  Introduction to Pt and family. Escorted family to see Pt. Provided ministry of presence.
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
## 2016-08-17 NOTE — Progress Notes (Signed)
Called to Dr. Melba Coon and updated on mild contractions.  Discussed transfer to [facility] once cleared by ER provider.  Discussed plan with ER Provider.
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
## 2016-08-17 NOTE — Progress Notes (Signed)
Patient cleared by ER provider, transport being arranged for direct admit to L&D room 169.
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
## 2016-08-17 NOTE — ED Notes (Signed)
[facility] called  

## 2016-08-17 NOTE — ED Notes (Signed)
[facility] at bedside 

## 2016-08-17 NOTE — ED Notes (Signed)
OB [facility] at bedside

## 2016-08-17 NOTE — Progress Notes (Signed)
[facility] here for transport. 
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
## 2016-08-18 ENCOUNTER — Encounter (HOSPITAL_COMMUNITY): Payer: Self-pay | Admitting: Obstetrics and Gynecology

## 2016-08-18 DIAGNOSIS — O9A213 Injury, poisoning and certain other consequences of external causes complicating pregnancy, third trimester: Secondary | ICD-10-CM | POA: Diagnosis not present

## 2016-08-18 LAB — MAGNESIUM: Magnesium: 3.9 mg/dL — ABNORMAL HIGH (ref 1.7–2.4)

## 2016-08-18 LAB — AMNISURE RUPTURE OF MEMBRANE (ROM) NOT AT ARMC: AMNISURE: NEGATIVE

## 2016-08-18 MED ORDER — METHYLDOPA 500 MG PO TABS
750.0000 mg | ORAL_TABLET | Freq: Three times a day (TID) | ORAL | Status: DC
  Filled 2016-08-18 (×3): qty 1

## 2016-08-18 MED ORDER — PRENATAL MULTIVITAMIN CH: 1.0000 | ORAL_TABLET | Freq: Every day | ORAL | Status: DC

## 2016-08-18 MED ORDER — MAGNESIUM SULFATE 40 G IN LACTATED RINGERS - SIMPLE
2.0000 g/h | INTRAVENOUS | Status: DC
  Administered 2016-08-18: 2 g/h via INTRAVENOUS
  Filled 2016-08-18: qty 500

## 2016-08-18 MED ORDER — HYDROCHLOROTHIAZIDE 25 MG PO TABS
25.0000 mg | ORAL_TABLET | Freq: Every day | ORAL | Status: DC
  Administered 2016-08-18: 25 mg via ORAL
  Filled 2016-08-18 (×2): qty 1

## 2016-08-18 MED ORDER — ACETAMINOPHEN 325 MG PO TABS
650.0000 mg | ORAL_TABLET | Freq: Four times a day (QID) | ORAL | Status: DC | PRN
  Administered 2016-08-18 (×2): 650 mg via ORAL
  Filled 2016-08-18 (×2): qty 2

## 2016-08-18 MED ORDER — MAGNESIUM SULFATE BOLUS VIA INFUSION
4.0000 g | Freq: Once | INTRAVENOUS | Status: AC
  Administered 2016-08-18: 4 g via INTRAVENOUS
  Filled 2016-08-18: qty 500

## 2016-08-18 MED ORDER — METHYLDOPA 250 MG PO TABS
250.0000 mg | ORAL_TABLET | Freq: Three times a day (TID) | ORAL | Status: DC
  Administered 2016-08-18: 250 mg via ORAL
  Filled 2016-08-18 (×4): qty 1

## 2016-08-18 NOTE — OB Triage Note (Signed)
Discharge instructions reviewed with pt and husband, they state understanding and agreement with plan to go home and follow up in office tomorrow.  Pt denies any questions or concerns at this time, WC by RN to main entrance to private vehicle.
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
## 2016-08-18 NOTE — Progress Notes (Addendum)
Patient ID: Carly Taylor, female   DOB: Jan 01, 1996, 21 y.o.   MRN: [id number]   Doing well, rested o/n.  +FM x 2, no LOF, no VB, some ctx  AFVSS 140-150/90's gen NAD FHTs A 125-135, mod var, accels, category 1 B 125-135, mod var, accels, category 1 Toco q 2-5 min  Magnesium for CP prophylaxis.  PCN for + gbbs  Continue current mgmt Close monitoring Wants regular diet - will monitor but allow to eat SVE 1/60/-3 bs [place] breech
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
## 2016-08-19 ENCOUNTER — Encounter (HOSPITAL_COMMUNITY): Payer: Self-pay

## 2016-08-19 LAB — CDS SEROLOGY

## 2016-09-04 ENCOUNTER — Telehealth (HOSPITAL_COMMUNITY): Payer: Self-pay | Admitting: *Deleted

## 2016-09-04 NOTE — Telephone Encounter (Signed)
Preadmission screen  

## 2016-09-05 ENCOUNTER — Encounter (HOSPITAL_COMMUNITY): Payer: Self-pay

## 2016-09-09 NOTE — Patient Instructions (Signed)
Carly Taylor  09/09/2016   Your procedure is scheduled on:  09/11/2016  Enter through the Main Entrance of [facility] up the phone at the desk and dial [phone].   Call this number if you have problems the morning of surgery: [phone]   Remember:   Do not eat food:After Midnight.  Do not drink clear liquids: After Midnight.  Take these medicines the morning of surgery with A SIP OF WATER: take your aldomet and hydrochlorathiazide as you usually do.     Do not wear jewelry, make-up or nail polish.  Do not wear lotions, powders, or perfumes. Do not wear deodorant.  Do not shave 48 hours prior to surgery.  Do not bring valuables to the hospital.  [facility] is not   responsible for any belongings or valuables brought to the hospital.  Contacts, dentures or bridgework may not be worn into surgery.  Leave suitcase in the car. After surgery it may be brought to your room.  For patients admitted to the hospital, checkout time is 11:00 AM the day of              discharge.   Patients discharged the day of surgery will not be allowed to drive             home.  Name and phone number of your driver: na  Special Instructions:   N/A   Please read over the following fact sheets that you were given:   Surgical Site Infection Prevention

## 2016-09-10 ENCOUNTER — Encounter (HOSPITAL_COMMUNITY)
Admission: RE | Admit: 2016-09-10 | Discharge: 2016-09-10 | Disposition: A | Discharge status: Home / Self Care | Payer: Medicaid Other | Source: Ambulatory Visit | Attending: Obstetrics and Gynecology | Admitting: Obstetrics and Gynecology

## 2016-09-10 LAB — COMPREHENSIVE METABOLIC PANEL
ALT: 12 U/L — AB (ref 14–54)
AST: 16 U/L (ref 15–41)
Albumin: 1.9 g/dL — ABNORMAL LOW (ref 3.5–5.0)
Alkaline Phosphatase: 180 U/L — ABNORMAL HIGH (ref 38–126)
Anion gap: 7 (ref 5–15)
BUN: 7 mg/dL (ref 6–20)
CHLORIDE: 104 mmol/L (ref 101–111)
CO2: 22 mmol/L (ref 22–32)
CREATININE: 0.62 mg/dL (ref 0.44–1.00)
Calcium: 8.2 mg/dL — ABNORMAL LOW (ref 8.9–10.3)
GFR calc Af Amer: >60 mL/min (ref >60)
GFR calc non Af Amer: >60 mL/min (ref >60)
Glucose, Bld: 85 mg/dL (ref 65–99)
Potassium: 3.8 mmol/L (ref 3.5–5.1)
SODIUM: 133 mmol/L — AB (ref 135–145)
Total Bilirubin: 0.5 mg/dL (ref 0.3–1.2)
Total Protein: 4.7 g/dL — ABNORMAL LOW (ref 6.5–8.1)

## 2016-09-10 LAB — CBC
HCT: 34.8 % — ABNORMAL LOW (ref 36.0–46.0)
HEMOGLOBIN: 12 g/dL (ref 12.0–15.0)
MCH: 30.4 pg (ref 26.0–34.0)
MCHC: 34.5 g/dL (ref 30.0–36.0)
MCV: 88.1 fL (ref 78.0–100.0)
PLATELETS: 223 10*3/uL (ref 150–400)
RBC: 3.95 MIL/uL (ref 3.87–5.11)
RDW: 16.4 % — ABNORMAL HIGH (ref 11.5–15.5)
WBC: 9.5 10*3/uL (ref 4.0–10.5)

## 2016-09-10 LAB — TYPE AND SCREEN
ABO/RH(D): O POS
Antibody Screen: NEGATIVE

## 2016-09-10 LAB — ABO/RH: ABO/RH(D): O POS
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
## 2016-09-11 ENCOUNTER — Encounter (HOSPITAL_COMMUNITY): Payer: Self-pay

## 2016-09-11 ENCOUNTER — Inpatient Hospital Stay (HOSPITAL_COMMUNITY): Payer: Medicaid Other | Admitting: Anesthesiology

## 2016-09-11 ENCOUNTER — Encounter (HOSPITAL_COMMUNITY)
Admission: RE | Disposition: A | Discharge status: Home / Self Care | Payer: Self-pay | Source: Ambulatory Visit | Attending: Obstetrics and Gynecology

## 2016-09-11 ENCOUNTER — Inpatient Hospital Stay (HOSPITAL_COMMUNITY)
Admission: RE | Admit: 2016-09-11 | Discharge: 2016-09-14 | DRG: 765 | Disposition: A | Discharge status: Home / Self Care | Payer: Medicaid Other | Source: Ambulatory Visit | Attending: Obstetrics and Gynecology | Admitting: Obstetrics and Gynecology

## 2016-09-11 DIAGNOSIS — Z3A37 37 weeks gestation of pregnancy: Secondary | ICD-10-CM | POA: Diagnosis not present

## 2016-09-11 DIAGNOSIS — O134 Gestational [pregnancy-induced] hypertension without significant proteinuria, complicating childbirth: Secondary | ICD-10-CM | POA: Diagnosis present

## 2016-09-11 DIAGNOSIS — O99334 Smoking (tobacco) complicating childbirth: Secondary | ICD-10-CM | POA: Diagnosis present

## 2016-09-11 DIAGNOSIS — O26833 Pregnancy related renal disease, third trimester: Secondary | ICD-10-CM | POA: Diagnosis present

## 2016-09-11 DIAGNOSIS — O321XX1 Maternal care for breech presentation, fetus 1: Principal | ICD-10-CM | POA: Diagnosis present

## 2016-09-11 DIAGNOSIS — F17210 Nicotine dependence, cigarettes, uncomplicated: Secondary | ICD-10-CM | POA: Diagnosis present

## 2016-09-11 DIAGNOSIS — O30043 Twin pregnancy, dichorionic/diamniotic, third trimester: Secondary | ICD-10-CM | POA: Diagnosis present

## 2016-09-11 DIAGNOSIS — N189 Chronic kidney disease, unspecified: Secondary | ICD-10-CM | POA: Diagnosis present

## 2016-09-11 DIAGNOSIS — Z98891 History of uterine scar from previous surgery: Secondary | ICD-10-CM

## 2016-09-11 LAB — CBC
HEMATOCRIT: 35.6 % — AB (ref 36.0–46.0)
Hemoglobin: 12.3 g/dL (ref 12.0–15.0)
MCH: 30.6 pg (ref 26.0–34.0)
MCHC: 34.6 g/dL (ref 30.0–36.0)
MCV: 88.6 fL (ref 78.0–100.0)
Platelets: 213 10*3/uL (ref 150–400)
RBC: 4.02 MIL/uL (ref 3.87–5.11)
RDW: 16.5 % — AB (ref 11.5–15.5)
WBC: 9.2 10*3/uL (ref 4.0–10.5)

## 2016-09-11 LAB — RPR: RPR Ser Ql: NONREACTIVE

## 2016-09-11 SURGERY — Surgical Case
Anesthesia: Spinal | Site: Abdomen | Wound class: Clean Contaminated

## 2016-09-11 MED ORDER — DIPHENHYDRAMINE HCL 25 MG PO CAPS: 25.0000 mg | ORAL_CAPSULE | Freq: Four times a day (QID) | ORAL | Status: DC | PRN

## 2016-09-11 MED ORDER — LACTATED RINGERS IV SOLN
INTRAVENOUS | Status: DC
  Administered 2016-09-11: 09:00:00 via INTRAVENOUS

## 2016-09-11 MED ORDER — SCOPOLAMINE 1 MG/3DAYS TD PT72
1.0000 | MEDICATED_PATCH | Freq: Once | TRANSDERMAL | Status: DC | PRN
  Administered 2016-09-11: 1.5 mg via TRANSDERMAL
  Filled 2016-09-11: qty 1

## 2016-09-11 MED ORDER — OXYTOCIN 10 UNIT/ML IJ SOLN
INTRAVENOUS | Status: DC | PRN
  Administered 2016-09-11: 40 [IU] via INTRAVENOUS

## 2016-09-11 MED ORDER — NALOXONE HCL 0.4 MG/ML IJ SOLN: 0.4000 mg | INTRAMUSCULAR | Status: DC | PRN

## 2016-09-11 MED ORDER — EPHEDRINE SULFATE 50 MG/ML IJ SOLN
INTRAMUSCULAR | Status: DC | PRN
  Administered 2016-09-11: 10 mg via INTRAVENOUS

## 2016-09-11 MED ORDER — DIPHENHYDRAMINE HCL 50 MG/ML IJ SOLN: 12.5000 mg | INTRAMUSCULAR | Status: DC | PRN

## 2016-09-11 MED ORDER — EPHEDRINE 5 MG/ML INJ
INTRAVENOUS | Status: AC
  Filled 2016-09-11: qty 10

## 2016-09-11 MED ORDER — PANTOPRAZOLE SODIUM 40 MG PO TBEC
40.0000 mg | DELAYED_RELEASE_TABLET | Freq: Once | ORAL | Status: AC
  Administered 2016-09-11: 40 mg via ORAL
  Filled 2016-09-11: qty 1

## 2016-09-11 MED ORDER — ONDANSETRON HCL 4 MG/2ML IJ SOLN
INTRAMUSCULAR | Status: DC | PRN
  Administered 2016-09-11: 4 mg via INTRAVENOUS

## 2016-09-11 MED ORDER — ONDANSETRON HCL 4 MG/2ML IJ SOLN
INTRAMUSCULAR | Status: AC
  Filled 2016-09-11: qty 2

## 2016-09-11 MED ORDER — OXYTOCIN 40 UNITS IN LACTATED RINGERS INFUSION - SIMPLE MED: 2.5000 [IU]/h | INTRAVENOUS | Status: AC

## 2016-09-11 MED ORDER — BUPIVACAINE IN DEXTROSE 0.75-8.25 % IT SOLN
INTRATHECAL | Status: DC | PRN
  Administered 2016-09-11: 1.4 mL via INTRATHECAL

## 2016-09-11 MED ORDER — METHYLDOPA 500 MG PO TABS
500.0000 mg | ORAL_TABLET | Freq: Every day | ORAL | Status: DC
Start: 2016-09-11 — End: 2016-09-11
  Filled 2016-09-11: qty 1

## 2016-09-11 MED ORDER — PRENATAL MULTIVITAMIN CH
1.0000 | ORAL_TABLET | Freq: Every day | ORAL | Status: DC
  Administered 2016-09-13: 1 via ORAL
  Filled 2016-09-11: qty 1

## 2016-09-11 MED ORDER — FAMOTIDINE 20 MG PO TABS
20.0000 mg | ORAL_TABLET | Freq: Once | ORAL | Status: AC | PRN
  Administered 2016-09-11: 20 mg via ORAL
  Filled 2016-09-11: qty 1

## 2016-09-11 MED ORDER — METHYLDOPA 500 MG PO TABS
500.0000 mg | ORAL_TABLET | Freq: Every day | ORAL | Status: DC
  Administered 2016-09-12 – 2016-09-14 (×3): 500 mg via ORAL
  Filled 2016-09-11 (×4): qty 1

## 2016-09-11 MED ORDER — ACETAMINOPHEN 325 MG PO TABS
650.0000 mg | ORAL_TABLET | ORAL | Status: DC | PRN
  Administered 2016-09-12 – 2016-09-13 (×4): 650 mg via ORAL
  Filled 2016-09-11 (×4): qty 2

## 2016-09-11 MED ORDER — TETANUS-DIPHTH-ACELL PERTUSSIS 5-2.5-18.5 LF-MCG/0.5 IM SUSP: 0.5000 mL | Freq: Once | INTRAMUSCULAR | Status: DC

## 2016-09-11 MED ORDER — NALBUPHINE HCL 10 MG/ML IJ SOLN: 5.0000 mg | INTRAMUSCULAR | Status: DC | PRN

## 2016-09-11 MED ORDER — PHENYLEPHRINE 8 MG IN D5W 100 ML (0.08MG/ML) PREMIX OPTIME
INJECTION | INTRAVENOUS | Status: DC | PRN
  Administered 2016-09-11: 30 ug/min via INTRAVENOUS

## 2016-09-11 MED ORDER — PHENYLEPHRINE HCL 10 MG/ML IJ SOLN
INTRAMUSCULAR | Status: DC | PRN
  Administered 2016-09-11: 80 ug via INTRAVENOUS

## 2016-09-11 MED ORDER — SODIUM CHLORIDE 0.9 % IR SOLN
Status: DC | PRN
  Administered 2016-09-11: 200 mL

## 2016-09-11 MED ORDER — SIMETHICONE 80 MG PO CHEW
80.0000 mg | CHEWABLE_TABLET | ORAL | Status: DC | PRN
  Administered 2016-09-13: 80 mg via ORAL

## 2016-09-11 MED ORDER — NALBUPHINE HCL 10 MG/ML IJ SOLN: 5.0000 mg | Freq: Once | INTRAMUSCULAR | Status: DC | PRN

## 2016-09-11 MED ORDER — ACETAMINOPHEN 500 MG PO TABS
1000.0000 mg | ORAL_TABLET | Freq: Four times a day (QID) | ORAL | Status: AC
  Administered 2016-09-11 – 2016-09-12 (×3): 1000 mg via ORAL
  Filled 2016-09-11 (×3): qty 2

## 2016-09-11 MED ORDER — DIPHENHYDRAMINE HCL 25 MG PO CAPS
25.0000 mg | ORAL_CAPSULE | ORAL | Status: DC | PRN
  Filled 2016-09-11: qty 1

## 2016-09-11 MED ORDER — PRENATAL MULTIVITAMIN CH
1.0000 | ORAL_TABLET | Freq: Every day | ORAL | Status: DC
  Administered 2016-09-12 – 2016-09-14 (×3): 1 via ORAL
  Filled 2016-09-11 (×3): qty 1

## 2016-09-11 MED ORDER — SIMETHICONE 80 MG PO CHEW
80.0000 mg | CHEWABLE_TABLET | ORAL | Status: DC
  Administered 2016-09-12 – 2016-09-13 (×3): 80 mg via ORAL
  Filled 2016-09-11 (×3): qty 1

## 2016-09-11 MED ORDER — SODIUM CHLORIDE 0.9% FLUSH: 3.0000 mL | INTRAVENOUS | Status: DC | PRN

## 2016-09-11 MED ORDER — FENTANYL CITRATE (PF) 100 MCG/2ML IJ SOLN
INTRAMUSCULAR | Status: AC
  Filled 2016-09-11: qty 2

## 2016-09-11 MED ORDER — SENNOSIDES-DOCUSATE SODIUM 8.6-50 MG PO TABS
2.0000 | ORAL_TABLET | ORAL | Status: DC
  Administered 2016-09-12 – 2016-09-13 (×3): 2 via ORAL
  Filled 2016-09-11 (×3): qty 2

## 2016-09-11 MED ORDER — OXYCODONE HCL 5 MG PO TABS
10.0000 mg | ORAL_TABLET | ORAL | Status: DC | PRN
  Filled 2016-09-11: qty 2

## 2016-09-11 MED ORDER — NALOXONE HCL 2 MG/2ML IJ SOSY
1.0000 ug/kg/h | PREFILLED_SYRINGE | INTRAVENOUS | Status: DC | PRN
  Filled 2016-09-11: qty 2

## 2016-09-11 MED ORDER — LACTATED RINGERS IV SOLN
INTRAVENOUS | Status: DC
  Administered 2016-09-12: 03:00:00 via INTRAVENOUS

## 2016-09-11 MED ORDER — OXYCODONE HCL 5 MG PO TABS
5.0000 mg | ORAL_TABLET | ORAL | Status: DC | PRN
  Administered 2016-09-12: 5 mg via ORAL

## 2016-09-11 MED ORDER — MEPERIDINE HCL 25 MG/ML IJ SOLN: 6.2500 mg | INTRAMUSCULAR | Status: DC | PRN

## 2016-09-11 MED ORDER — FENTANYL CITRATE (PF) 100 MCG/2ML IJ SOLN
INTRAMUSCULAR | Status: DC | PRN
Start: 2016-09-11 — End: 2016-09-11
  Administered 2016-09-11: 25 ug via INTRAVENOUS

## 2016-09-11 MED ORDER — CEFAZOLIN SODIUM-DEXTROSE 2-4 GM/100ML-% IV SOLN
2.0000 g | INTRAVENOUS | Status: AC
  Administered 2016-09-11: 2 g via INTRAVENOUS
  Filled 2016-09-11: qty 100

## 2016-09-11 MED ORDER — ONDANSETRON HCL 4 MG/2ML IJ SOLN: 4.0000 mg | Freq: Three times a day (TID) | INTRAMUSCULAR | Status: DC | PRN

## 2016-09-11 MED ORDER — METHYLDOPA 250 MG PO TABS
250.0000 mg | ORAL_TABLET | ORAL | Status: DC
  Administered 2016-09-11 – 2016-09-13 (×5): 250 mg via ORAL
  Filled 2016-09-11 (×9): qty 1

## 2016-09-11 MED ORDER — METOCLOPRAMIDE HCL 10 MG PO TABS
10.0000 mg | ORAL_TABLET | Freq: Once | ORAL | Status: AC | PRN
  Administered 2016-09-11: 10 mg via ORAL
  Filled 2016-09-11: qty 1

## 2016-09-11 MED ORDER — HYDROCHLOROTHIAZIDE 25 MG PO TABS
25.0000 mg | ORAL_TABLET | Freq: Every day | ORAL | Status: DC
  Administered 2016-09-12 – 2016-09-14 (×4): 25 mg via ORAL
  Filled 2016-09-11 (×5): qty 1

## 2016-09-11 MED ORDER — COCONUT OIL OIL
1.0000 "application " | TOPICAL_OIL | Status: DC | PRN
  Administered 2016-09-13: 1 via TOPICAL
  Filled 2016-09-11: qty 120

## 2016-09-11 MED ORDER — SIMETHICONE 80 MG PO CHEW
80.0000 mg | CHEWABLE_TABLET | Freq: Three times a day (TID) | ORAL | Status: DC
  Administered 2016-09-11 – 2016-09-14 (×9): 80 mg via ORAL
  Filled 2016-09-11 (×9): qty 1

## 2016-09-11 MED ORDER — WITCH HAZEL-GLYCERIN EX PADS: 1.0000 "application " | MEDICATED_PAD | CUTANEOUS | Status: DC | PRN

## 2016-09-11 MED ORDER — DIBUCAINE 1 % RE OINT: 1.0000 "application " | TOPICAL_OINTMENT | RECTAL | Status: DC | PRN

## 2016-09-11 MED ORDER — ZOLPIDEM TARTRATE 5 MG PO TABS: 5.0000 mg | ORAL_TABLET | Freq: Every evening | ORAL | Status: DC | PRN

## 2016-09-11 MED ORDER — MORPHINE SULFATE (PF) 0.5 MG/ML IJ SOLN
INTRAMUSCULAR | Status: AC
  Filled 2016-09-11: qty 10

## 2016-09-11 MED ORDER — MORPHINE SULFATE (PF) 0.5 MG/ML IJ SOLN
INTRAMUSCULAR | Status: DC | PRN
  Administered 2016-09-11: .1 mg via EPIDURAL

## 2016-09-11 MED ORDER — MENTHOL 3 MG MT LOZG: 1.0000 | LOZENGE | OROMUCOSAL | Status: DC | PRN

## 2016-09-11 MED ORDER — ACETAMINOPHEN 160 MG/5ML PO SOLN
975.0000 mg | Freq: Once | ORAL | Status: AC
  Administered 2016-09-11: 975 mg via ORAL
  Filled 2016-09-11: qty 40.6

## 2016-09-11 MED ORDER — SCOPOLAMINE 1 MG/3DAYS TD PT72: 1.0000 | MEDICATED_PATCH | Freq: Once | TRANSDERMAL | Status: DC

## 2016-09-11 MED ORDER — BUPIVACAINE IN DEXTROSE 0.75-8.25 % IT SOLN
INTRATHECAL | Status: AC
  Filled 2016-09-11: qty 2

## 2016-09-11 MED ORDER — LACTATED RINGERS IV SOLN
INTRAVENOUS | Status: DC
  Administered 2016-09-11 (×2): via INTRAVENOUS

## 2016-09-11 MED ORDER — PHENYLEPHRINE 8 MG IN D5W 100 ML (0.08MG/ML) PREMIX OPTIME
INJECTION | INTRAVENOUS | Status: AC
  Filled 2016-09-11: qty 100

## 2016-09-11 MED ORDER — PHENYLEPHRINE 40 MCG/ML (10ML) SYRINGE FOR IV PUSH (FOR BLOOD PRESSURE SUPPORT)
PREFILLED_SYRINGE | INTRAVENOUS | Status: AC
  Filled 2016-09-11: qty 10

## 2016-09-11 SURGICAL SUPPLY — 41 items
BENZOIN TINCTURE PRP APPL 2/3 (GAUZE/BANDAGES/DRESSINGS) ×3 IMPLANT
CHLORAPREP W/TINT 26ML (MISCELLANEOUS) ×3 IMPLANT
CLAMP CORD UMBIL (MISCELLANEOUS) IMPLANT
CLOSURE WOUND 1/2 X4 (GAUZE/BANDAGES/DRESSINGS) ×1
CLOTH BEACON ORANGE TIMEOUT ST (SAFETY) ×3 IMPLANT
DEVICE BLD TRNS LUER ATTCH (MISCELLANEOUS) ×6 IMPLANT
DRSG OPSITE POSTOP 4X10 (GAUZE/BANDAGES/DRESSINGS) ×3 IMPLANT
ELECT REM PT RETURN 9FT ADLT (ELECTROSURGICAL) ×3
ELECTRODE REM PT RTRN 9FT ADLT (ELECTROSURGICAL) ×1 IMPLANT
EXTRACTOR VACUUM KIWI (MISCELLANEOUS) IMPLANT
GLOVE BIO SURGEON STRL SZ 6.5 (GLOVE) ×2 IMPLANT
GLOVE BIO SURGEONS STRL SZ 6.5 (GLOVE) ×1
GLOVE BIOGEL PI IND STRL 7.0 (GLOVE) ×2 IMPLANT
GLOVE BIOGEL PI INDICATOR 7.0 (GLOVE) ×4
GOWN STRL REUS W/TWL LRG LVL3 (GOWN DISPOSABLE) ×6 IMPLANT
KIT ABG SYR 3ML LUER SLIP (SYRINGE) IMPLANT
NDL SAFETY ECLIPSE 18X1.5 (NEEDLE) ×1 IMPLANT
NEEDLE HYPO 18GX1.5 SHARP (NEEDLE) ×2
NEEDLE HYPO 25X5/8 SAFETYGLIDE (NEEDLE) IMPLANT
NS IRRIG 1000ML POUR BTL (IV SOLUTION) ×3 IMPLANT
PACK C SECTION WH (CUSTOM PROCEDURE TRAY) ×3 IMPLANT
PAD OB MATERNITY 4.3X12.25 (PERSONAL CARE ITEMS) ×3 IMPLANT
PENCIL SMOKE EVAC W/HOLSTER (ELECTROSURGICAL) ×3 IMPLANT
RETRACTOR TRAXI PANNICULUS (MISCELLANEOUS) ×1 IMPLANT
RTRCTR C-SECT PINK 25CM LRG (MISCELLANEOUS) ×3 IMPLANT
STRIP CLOSURE SKIN 1/2X4 (GAUZE/BANDAGES/DRESSINGS) ×2 IMPLANT
SUT CHROMIC 1 CTX 36 (SUTURE) ×6 IMPLANT
SUT PLAIN 0 NONE (SUTURE) IMPLANT
SUT PLAIN 2 0 XLH (SUTURE) ×3 IMPLANT
SUT VIC AB 0 CT1 27 (SUTURE) ×4
SUT VIC AB 0 CT1 27XBRD ANBCTR (SUTURE) ×2 IMPLANT
SUT VIC AB 2-0 CT1 27 (SUTURE) ×2
SUT VIC AB 2-0 CT1 TAPERPNT 27 (SUTURE) ×1 IMPLANT
SUT VIC AB 3-0 CT1 27 (SUTURE)
SUT VIC AB 3-0 CT1 TAPERPNT 27 (SUTURE) IMPLANT
SUT VIC AB 4-0 KS 27 (SUTURE) ×3 IMPLANT
SYR BULB 3OZ (MISCELLANEOUS) ×3 IMPLANT
SYRINGE 10CC LL (SYRINGE) ×3 IMPLANT
TOWEL OR 17X24 6PK STRL BLUE (TOWEL DISPOSABLE) ×3 IMPLANT
TRAXI PANNICULUS RETRACTOR (MISCELLANEOUS) ×2
TRAY FOLEY BAG SILVER LF 14FR (SET/KITS/TRAYS/PACK) ×3 IMPLANT

## 2016-09-11 NOTE — Transfer of Care (Signed)
Immediate Anesthesia Transfer of Care Note  Patient: Carly Taylor  Procedure(s) Performed: Procedure(s) with comments: [place] (N/A) - Tracey T- RNFA  Patient Location: PACU  Anesthesia Type:Spinal  Level of Consciousness: awake, alert  and oriented  Airway & Oxygen Therapy: Patient Spontanous Breathing  Post-op Assessment: Report given to RN and Post -op Vital signs reviewed and stable  Post vital signs: Reviewed and stable  Last Vitals:  Vitals:   09/11/16 0726  BP: (!) 150/100  Pulse: 91  Resp: 16  Temp: 36.6 C    Last Pain:  Vitals:   09/11/16 0726  TempSrc: Oral         Complications: No apparent anesthesia complications

## 2016-09-11 NOTE — Progress Notes (Signed)
Patient ID: Carly Taylor, female   DOB: 11-12-1995, 21 y.o.   MRN: [id number] Pt states no changes in dictated H&P and no preeclamptic symptoms.   Brief exam WNL and labs WNL except proteinuria from her nephropathy.  Ready to proceed.

## 2016-09-11 NOTE — Lactation Note (Signed)
This note was copied from a baby's chart. Lactation Consultation Note  Patient Name: Carly Taylor [facility] Today's Date: 09/11/2016 Reason for consult: Initial assessment;Multiple gestation;Other (Comment);Infant < 6lbs (Early Term infant)   Initial consult with first time mom of 29 week twins at 75 hour of age in PACU. Both infants are < 6 pounds. CBG's are pending.   [name] was latched to left breast in the football hold and actively feeding when [facility] entered room. She fed for 25 minutes and self detached.   [name] was being latched by Barr[employer]. He was on and off the breast in the football hold to the right breast. He was changed to the cross cradle hold and was not able to sustain latch. LC hand expressed 2 cc colostrum and spoon fed it to infant. Infant was then left swaddled and placed in crib to have CBG drawn.   Mom with soft compressible breasts and small compressible areola with everted nipples. Colostrum easily expressible from both breasts. Mom denies breast changes with pregnancy.   Mom is unsure if she wants to BF. She is planning to give it a try. She suggested she may BF Carly Taylor and bottle feed Carly Taylor since he did not latch well. Discussed with mom that infants are early term infants and that they may feed well at times and not at others. Enc mom to offer breast at least every 3 hours and with feeding cues. Discussed possibility of supplementation being needed for both babies due to [place] and weights. Mom ok to use formula if needed. Discussed hand expression and pumping as ways to stimulate milk production and provide supplement for infants. Mom is planning to let [facility] know later what her plans may be.   LPT infant policy given due to [place] and weights. Explained handout to parents and enc them to follow plan. Mom and dad have no questions/concerns at this time. Feeding logs given with instructions for use.   BF Resources Handout and [place] Brochure given, mom informed of IP/OP  Services, BF Support Groups and [place] phone #. Enc mom to call out for feeding assistance as needed.    Maternal Data Formula Feeding for Exclusion: Yes Reason for exclusion: Mother's choice to formula and breast feed on admission Has patient been taught Hand Expression?: Yes Does the patient have breastfeeding experience prior to this delivery?: No  Feeding Feeding Type: Breast Fed Length of feed: 25 min  LATCH Score/Interventions Latch: Grasps breast easily, tongue down, lips flanged, rhythmical sucking.  Audible Swallowing: A few with stimulation Intervention(s): Skin to skin;Hand expression  Type of Nipple: Everted at rest and after stimulation  Comfort (Breast/Nipple): Soft / non-tender     Hold (Positioning): Assistance needed to correctly position infant at breast and maintain latch.  LATCH Score: 8  Lactation Tools Discussed/Used WIC Program: Yes   Consult Status Consult Status: Follow-up Date: 09/12/16 Follow-up type: In-patient    Carly Taylor Carly Taylor 09/11/2016, 11:07 AM

## 2016-09-11 NOTE — Anesthesia Procedure Notes (Signed)
Spinal  Patient location during procedure: OR Staffing Anesthesiologist: Lyndle Herrlich Preanesthetic Checklist Completed: patient identified, site marked, surgical consent, pre-op evaluation, timeout performed, IV checked, risks and benefits discussed and monitors and equipment checked Spinal Block Patient position: sitting Prep: DuraPrep Patient monitoring: heart rate, cardiac monitor, continuous pulse ox and blood pressure Approach: midline Location: L3-4 Injection technique: single-shot Needle Needle type: Sprotte  Needle gauge: 24 G Needle length: 9 cm Assessment Sensory level: T4 Additional Notes Spinal Dosage in OR  .75% Bupivicaine ml       1.4     [id number]   mcg        100    Fentanyl mcg            25

## 2016-09-11 NOTE — Progress Notes (Signed)
Called Dr. Willis Modena to inform him that patient's BP's  have been trending down since admission to [facility]. (136/90, 133/69, 118/62.) He stated it was ok for patient to receive Aldomet 250 mg at the scheduled time.

## 2016-09-11 NOTE — Op Note (Signed)
Operative Note    Preoperative Diagnosis Twin pregnancy at [redacted] weeks gestation Breech/vertex presentation Renal disease with superimposed gestational     hypertension   Postoperative Diagnosis same  Procedure Primary low transverse c-section with 2 layer closure of uterus  Surgeon Paula Compton, MD Lester Mapleton, RNFA  Anesthesia Spinal  Fluids: EBL 761ml UOP 145ml clear IVF 2100 ml LR  Findings Twin A--viable female in complete breech presentation.  Apgars 8.9 Weight pending.  Twin B--viable female in vertex presentation.  Apgars 9,9.  Weight pending.  Normal uterus tubes and ovaries.    Specimen Twin placentas to pathology  Procedure Note   Patient was taken to the operating room where spinal anesthesia was obtained and found to be adequate by Allis clamp test. She was prepped and draped in the normal sterile fashion in the dorsal supine position with a leftward tilt. An appropriate time out was performed. A Pfannenstiel skin incision was then made with the scalpel and carried through to the underlying layer of fascia by sharp dissection and Bovie cautery. The fascia was nicked in the midline and the incision was extended laterally with Mayo scissors. The inferior aspect of the incision was grasped Coker clamps and dissected off the underlying rectus muscles. In a similar fashion the superior aspect was dissected off the rectus muscles. Rectus muscles were separated in the midline and the peritoneal cavity entered bluntly. The peritoneal incision was then extended both superiorly and inferiorly with careful attention to avoid both bowel and bladder. The Alexis self-retaining wound retractor was then placed within the incision and the lower uterine segment exposed. The bladder flap was developed with Metzenbaum scissors and pushed away from the lower uterine segment. The lower uterine segment was then incised in a transverse fashion and the cavity itself entered bluntly. The  incision was extended bluntly. Twin A was a complete breech and the feet were grasped and the baby easily delivered to the shoulders, arms reduced over the chest, and the head delivered flexed.  The cord was cut and clamped and the infant handed to the waiting pediatricians after one minute of delayed clamping.  Attention was then turned to twin B where the sac was ruptured and vertex presentation noted. The head  was then lifted and delivered from the incision without difficulty. The remainder of the infant delivered and the nose and mouth bulb suctioned with the cord clamped and cut as well. The infant was handed off to the waiting pediatricians after one minute delayed clamping. The placenta was then spontaneously expressed from the uterus and the uterus cleared of all clots and debris with moist lap sponge. The uterine incision was then repaired in 2 layers the first layer was a running locked layer 1-0 chromic and the second an imbricating layer of the same suture. The tubes and ovaries were inspected and the gutters cleared of all clots and debris. The uterine incision was inspected and found to be hemostatic. All instruments and sponges as well as the Alexis retractor were then removed from the abdomen. The rectus muscles and peritoneum were then reapproximated with several interrupted mattress sutures of 2-0 Vicryl. The fascia was then closed with 0 Vicryl in a running fashion. Subcutaneous tissue was reapproximated with 3-0 plain in a running fashion. The skin was closed with a subcuticular stitch of 4-0 Vicryl on a Keith needle and then reinforced with benzoin and Steri-Strips. At the conclusion of the procedure all instruments and sponge counts were correct. Patient was taken to  the recovery room in good condition with her babies accompanying her skin to skin.

## 2016-09-11 NOTE — Anesthesia Postprocedure Evaluation (Signed)
Anesthesia Post Note  Patient: Carly Taylor  Procedure(s) Performed: Procedure(s) (LRB): CESAREAN SECTION MULTI-GESTATIONAL (N/A)  Patient location during evaluation: PACU Anesthesia Type: Spinal Level of consciousness: awake Pain management: satisfactory to patient Vital Signs Assessment: post-procedure vital signs reviewed and stable Respiratory status: spontaneous breathing Cardiovascular status: blood pressure returned to baseline Postop Assessment: no headache and spinal receding Anesthetic complications: no        Last Vitals:  Vitals:   09/11/16 1031 09/11/16 1046  BP: 135/89 127/79  Pulse: 64 71  Resp: 18 16  Temp:  36.7 C    Last Pain:  Vitals:   09/11/16 1046  TempSrc: Oral  PainSc: 4    Pain Goal:                 Riccardo Dubin

## 2016-09-12 LAB — CBC
HEMATOCRIT: 28.2 % — AB (ref 36.0–46.0)
HEMOGLOBIN: 9.7 g/dL — AB (ref 12.0–15.0)
MCH: 30.8 pg (ref 26.0–34.0)
MCHC: 34.4 g/dL (ref 30.0–36.0)
MCV: 89.5 fL (ref 78.0–100.0)
Platelets: 161 10*3/uL (ref 150–400)
RBC: 3.15 MIL/uL — AB (ref 3.87–5.11)
RDW: 17.1 % — ABNORMAL HIGH (ref 11.5–15.5)
WBC: 8.5 10*3/uL (ref 4.0–10.5)

## 2016-09-12 NOTE — Addendum Note (Signed)
Addendum  created 09/12/16 0827 by Riki Sheer, CRNA   Sign clinical note

## 2016-09-12 NOTE — Progress Notes (Signed)
Patient ID: Carly Taylor, female   DOB: [birth year], 21 y.o.   MRN: [id number] POD #1  Pt doing well. Pain controlled with meds. Denies HA, blurry vision, CP or SOB. Ambulating and voiding well. Breast/bottlefeeding- bonding well with babies. Plans circ for Tommi Rumps in office VSS 107-122/62-69 since midnight  ABD - dressing c/d/I EXT - no Homans  8.5>9.7<161  A/P: POD#1 - stable         BP controlled         Routine pp/post op care

## 2016-09-12 NOTE — Anesthesia Postprocedure Evaluation (Signed)
Anesthesia Post Note  Patient: Carly Taylor  Procedure(s) Performed: Procedure(s) (LRB): [place] (N/A)  Patient location during evaluation: Mother Baby Anesthesia Type: Spinal Level of consciousness: oriented and awake and alert Pain management: pain level controlled Vital Signs Assessment: post-procedure vital signs reviewed and stable Respiratory status: spontaneous breathing and respiratory function stable Cardiovascular status: blood pressure returned to baseline and stable Postop Assessment: no headache and no backache Anesthetic complications: no        Last Vitals:  Vitals:   09/12/16 0230 09/12/16 0615  BP: 114/67 122/69  Pulse: 74 66  Resp: 18 18  Temp: 37.1 C 36.6 C    Last Pain:  Vitals:   09/12/16 0615  TempSrc: Oral  PainSc: 4    Pain Goal:                 Riki Sheer

## 2016-09-12 NOTE — Lactation Note (Signed)
This note was copied from a baby's chart. Lactation Consultation Note  Patient Name: Carly Taylor [facility] [id number] Date: 09/12/2016   Babies 63 hrs old.  Mom states babies are more tired, and baby Girl A fed for 5 minutes at last feeding.  Encouraged STS, breast massage, and hand expression frequently.  Recommended double pumping >8 times per 24 hrs.  Reassured that babies were acting very appropriate for being 37 week twins, and weighing 5 lbs.   Informed Mom to increase volume of formula+/EBM babies are being fed to 20 ml.  Mom aware of importance of waking babies at 3 hrs, and feeding sooner if they act hungry. Talked about [facility] loaner pump program available to her.  [place] appointment 5/15.   Broadus John 09/12/2016, 4:23 PM

## 2016-09-13 NOTE — Progress Notes (Signed)
Subjective: Postpartum Day 2: Cesarean Delivery Patient reports incisional pain, tolerating PO and no problems voiding.  Pain controlled, nl lochia  Objective: Vital signs in last 24 hours: Temp:  [97.8 F (36.6 C)-98.4 F (36.9 C)] 98.4 F (36.9 C) (05/11 0550) Pulse Rate:  [80-90] 88 (05/11 0550) Resp:  [18] 18 (05/11 0550) BP: (119-144)/(73-90) 144/88 (05/11 0550)  Physical Exam:  General: alert and no distress Lochia: appropriate Uterine Fundus: firm Incision: healing well DVT Evaluation: No evidence of DVT seen on physical exam.   Recent Labs  09/11/16 0720 09/12/16 0528  HGB 12.3 9.7*  HCT 35.6* 28.2*    Assessment/Plan: Status post Cesarean section. Doing well postoperatively.  Continue current care.  Luane Rochon Bovard-Stuckert 09/13/2016, 9:02 AM

## 2016-09-13 NOTE — Lactation Note (Signed)
This note was copied from a baby's chart. Lactation Consultation Note  Patient Name: SUNNIE ODDEN [facility] Today's Date: 09/13/2016   Visited with MOB, babies 33 hrs old.  Both babies continue to be sleepy at the breast, so Mom is focused on bottle feeding babies.  Baby A 3% weight loss, and Baby B 5% weight loss today.  Baby B appears slightly more jaundiced (level ok), and weight at 4 lbs 15 oz.  Mom had not pumped yet today, so asked if she would like to assisted with this.  Mom has bilateral pink nipples and complaining of some tenderness.  Coconut oil given with instructions, and assisted in applying this following her pumping this am.  Flange size changed to 21.  Mom pumped 6 ml this am.  Encouraged her to try to pump every 2 hrs today during the day.  Assisted MOB's cousin to bottle feed using pace method, baby A the 6 ml, and followed with 10 ml of formula.  Mom knows to alternate which baby receives her EBM.  Encouraged continued STS, and feeding at least every 3 hrs, and sooner if baby's are cueing to feed.  Some spit up noted.  Instructed MOB and cousin to burp babies prior to bottle feeding, and burp halfway through feeding.   Encouraged Mom to think about a pump loaner on discharge, as she is going to [facility] on 5/15. To call for assistance as needed, lactation to follow up in am.   Broadus John 09/13/2016, 11:40 AM

## 2016-09-13 NOTE — Lactation Note (Signed)
This note was copied from a baby's chart. Lactation Consultation Note Baby loosely swaddled, wrapped baby in blankets, applied hat. Baby had spit of formula X2. Mom stated she had just fed babies. Breast then formula.  Patient Name: [id number] Date: 09/13/2016 Reason for consult: Follow-up assessment   Maternal Data    Feeding    LATCH Score/Interventions                      Lactation Tools Discussed/Used     Consult Status Consult Status: Follow-up Date: 09/13/16 Follow-up type: In-patient    Theodoro Kalata 09/13/2016, 4:26 AM

## 2016-09-13 NOTE — Plan of Care (Signed)
Problem: Coping: Goal: Ability to cope will improve Outcome: Completed/Met Date Met: 09/13/16 Mother and FOB are coping well with care of twin newborns. Mother reports having a lot of extended family support following discharge. Many family members have been present and helpful during the hospital stay. Parents have demonstrated appropriate newborn care.

## 2016-09-13 NOTE — Lactation Note (Signed)
This note was copied from a baby's chart. Lactation Consultation Note Baby boy fussy in crib. Parents sleeping. Asked mom about feeding, stated just fed and supplemented. Baby loosely covered, had void and stool. LC changed diaper and swaddled. Slept well. Discussed w/mom supplementing and BF on cues. Patient Name: JACKELYNE SAYER [facility] IEPPI'R Date: 09/13/2016 Reason for consult: Follow-up assessment   Maternal Data    Feeding    LATCH Score/Interventions                      Lactation Tools Discussed/Used     Consult Status Consult Status: Follow-up Date: 09/13/16 (in pm) Follow-up type: In-patient    Theodoro Kalata 09/13/2016, 4:22 AM

## 2016-09-14 MED ORDER — OXYCODONE HCL 5 MG PO TABS: ORAL_TABLET | ORAL | 0 refills | Status: DC

## 2016-09-14 NOTE — Lactation Note (Signed)
This note was copied from a baby's chart. Lactation Consultation Note  Patient Name: FRANSISCA SHAWN [facility] [id number] Date: 09/14/2016 Reason for consult: Follow-up assessment   With this mom of LPI twins, now 61 hours old. The baby's were asleep in the bed with mom , each on a pillow, and mom sound asleep in bed. Risk of SIDS reviewed with mom, and she was very receptive to teaching. Mom undecided if she is going to continue pumping and providing EBm and breastfeed her babies. I told mom to call me is she decides to do a [facility] loaner DEP prior to discharge.    Maternal Data    Feeding Feeding Type: Bottle Fed - Formula  LATCH Score/Interventions                      Lactation Tools Discussed/Used     Consult Status Consult Status: Complete Follow-up type: Call as needed    Tonna Corner 09/14/2016, 8:26 AM

## 2016-09-14 NOTE — Progress Notes (Addendum)
Subjective: Postpartum Day 3: Cesarean Delivery Patient reports incisional pain and tolerating PO.  Ambulating, voiding.  Ready to go home!  Objective: Vital signs in last 24 hours: Temp:  [98.4 F (36.9 C)-98.7 F (37.1 C)] 98.4 F (36.9 C) (05/12 0519) Pulse Rate:  [76-96] 76 (05/12 0519) Resp:  [19-20] 19 (05/12 0519) BP: (139-144)/(78-89) 139/78 (05/12 0519) SpO2:  [99 %] 99 % (05/11 1721)  Physical Exam:  General: alert and no distress Lochia: appropriate Uterine Fundus: firm Incision: healing well DVT Evaluation: No evidence of DVT seen on physical exam.   Recent Labs  09/12/16 0528  HGB 9.7*  HCT 28.2*    Assessment/Plan: Status post Cesarean section. Doing well postoperatively.  Continue current care - d/c home with motrin, percocet and PNV.  F/U 2 weeks  Gillis Boardley Bovard-Stuckert 09/14/2016, 10:05 AM

## 2016-09-23 NOTE — Discharge Summary (Signed)
OB Discharge Summary     Patient Name: Carly Taylor DOB: 05-Feb-1996 MRN: [id number]  Date of admission: 09/11/2016 Delivering MD:    Durenda Age [[id number]]  [id number], GURKY   Marja Kays Scott [[id number]]  Paula Compton   Date of discharge: 09/23/2016  Admitting diagnosis: di-di twins, HTN Intrauterine pregnancy: [redacted]w[redacted]d     Secondary diagnosis:  Active Problems:   S/P primary low transverse C-section  Additional problems: chronic renal disease     Discharge diagnosis: twin, LTCS, term                                                                                                Post partum procedures:none  Augmentation: N/A  Complications: None  Hospital course:  Sceduled C/S   21 y.o. yo G1P1002 at [redacted]w[redacted]d was admitted to the hospital 09/11/2016 for scheduled cesarean section with the following indication:Malpresentation and twin IUP.  Membrane Rupture Time/Date:    Durenda Age [[id number]]  9:04 AM   Jenelle Mages [[id number]]  9:07 AM ,   Durenda Age [[id number]]  09/11/2016   Jenelle Mages [[id number]]  09/11/2016   Patient delivered a Viable infants.   Durenda Age [[id number]]  09/11/2016   Jenelle Mages [[id number]]  09/11/2016  Details of operation can be found in separate operative note.  Pateint had an uncomplicated postpartum course.  She is ambulating, tolerating a regular diet, passing flatus, and urinating well. Patient is discharged home in stable condition on  09/23/16         Physical exam  Vitals:   09/13/16 0550 09/13/16 1721 09/14/16 0519 09/14/16 1035  BP: (!) 144/88 (!) 144/89 139/78 129/84  Pulse: 88 96 76 98  Resp: 18 20 19    Temp: 98.4 F (36.9 C) 98.7 F (37.1 C) 98.4 F (36.9 C)   TempSrc: Oral Oral Oral   SpO2:  99%    Weight:      Height:       General: alert and no distress Lochia: appropriate Uterine Fundus: firm Incision:  Healing well with no significant drainage DVT Evaluation: No evidence of DVT seen on physical exam. Labs: Lab Results  Component Value Date   WBC 8.5 09/12/2016   HGB 9.7 (L) 09/12/2016   HCT 28.2 (L) 09/12/2016   MCV 89.5 09/12/2016   PLT 161 09/12/2016   CMP Latest Ref Rng & Units 09/10/2016  Glucose 65 - 99 mg/dL 85  BUN 6 - 20 mg/dL 7  Creatinine 0.44 - 1.00 mg/dL 0.62  Sodium 135 - 145 mmol/L 133(L)  Potassium 3.5 - 5.1 mmol/L 3.8  Chloride 101 - 111 mmol/L 104  CO2 22 - 32 mmol/L 22  Calcium 8.9 - 10.3 mg/dL 8.2(L)  Total Protein 6.5 - 8.1 g/dL 4.7(L)  Total Bilirubin 0.3 - 1.2 mg/dL 0.5  Alkaline Phos 38 - 126 U/L 180(H)  AST 15 - 41 U/L 16  ALT 14 - 54 U/L 12(L)    Discharge instruction: per After Visit Summary and "Baby and Me Booklet".  After visit meds:  Allergies as of 09/14/2016  Reactions   Nsaids Other (See Comments)   Pt is unable to take this medication due to kidney disease.     Nsaids Other (See Comments)   Hx of kidney disease      Medication List    TAKE these medications   acetaminophen 500 MG tablet Commonly known as:  TYLENOL Take 1,000 mg by mouth every 6 (six) hours as needed for headache.   calcium carbonate 500 MG chewable tablet Commonly known as:  TUMS - dosed in mg elemental calcium Chew 1 tablet by mouth 2 (two) times daily as needed for indigestion or heartburn.   fluticasone 50 MCG/ACT nasal spray Commonly known as:  FLONASE Place 1 spray into both nostrils daily as needed for allergies or rhinitis.   hydrochlorothiazide 25 MG tablet Commonly known as:  HYDRODIURIL Take 25 mg by mouth daily.   methyldopa 500 MG tablet Commonly known as:  ALDOMET Take 500 mg by mouth daily. Takes 500 mg in the morning and 250 mg at lunch and in the evening   methyldopa 250 MG tablet Commonly known as:  ALDOMET Take 250 mg by mouth 2 (two) times daily. Takes 250 mg at lunch and in the evening and 500 mg in the morning   oxyCODONE 5 MG  immediate release tablet Commonly known as:  Oxy IR/ROXICODONE 1-2 tab po q 6hr prn severe pain   prenatal multivitamin Tabs tablet Take 1 tablet by mouth daily.       Diet: routine diet  Activity: Advance as tolerated. Pelvic rest for 6 weeks.   Outpatient follow up:2 and 6 weeks Follow up Appt:No future appointments. Follow up Visit:No Follow-up on file.  Postpartum contraception: Undecided  Newborn Data:   Durenda Age [[id number]]  Live born female  Birth Weight: 5 lb 13.3 oz (2645 g) APGAR: 8, 9   Jenelle Mages [[id number]]  Live born female  Birth Weight: 5 lb 3.4 oz (2365 g) APGAR: 9, 9  Baby Feeding: Breast Disposition:home with mother   09/23/2016 Janyth Contes, MD

## 2016-11-07 ENCOUNTER — Ambulatory Visit (HOSPITAL_COMMUNITY)
Admission: EM | Admit: 2016-11-07 | Discharge: 2016-11-07 | Disposition: A | Discharge status: Home / Self Care | Payer: Medicaid Other | Attending: Family Medicine | Admitting: Family Medicine

## 2016-11-07 ENCOUNTER — Encounter (HOSPITAL_COMMUNITY): Payer: Self-pay | Admitting: Emergency Medicine

## 2016-11-07 DIAGNOSIS — R062 Wheezing: Secondary | ICD-10-CM

## 2016-11-07 DIAGNOSIS — B9789 Other viral agents as the cause of diseases classified elsewhere: Secondary | ICD-10-CM | POA: Diagnosis not present

## 2016-11-07 DIAGNOSIS — J069 Acute upper respiratory infection, unspecified: Secondary | ICD-10-CM

## 2016-11-07 MED ORDER — ALBUTEROL SULFATE HFA 108 (90 BASE) MCG/ACT IN AERS: 2.0000 | INHALATION_SPRAY | RESPIRATORY_TRACT | 0 refills | Status: DC | PRN

## 2016-11-07 MED ORDER — HYDROCODONE-HOMATROPINE 5-1.5 MG/5ML PO SYRP: 5.0000 mL | ORAL_SOLUTION | Freq: Four times a day (QID) | ORAL | 0 refills | Status: DC | PRN

## 2016-11-07 NOTE — ED Provider Notes (Signed)
  Carly Taylor   [id number] 11/07/16 Arrival Time: 1223  ASSESSMENT & PLAN:  Today you were diagnosed with the following: 1. Viral URI with cough   2. Wheezing    You have been prescribed prescription medications this visit.   Meds ordered this encounter  Medications  . albuterol (PROVENTIL HFA;VENTOLIN HFA) 108 (90 Base) MCG/ACT inhaler    Sig: Inhale 2 puffs into the lungs every 4 (four) hours as needed for wheezing or shortness of breath.    Dispense:  1 Inhaler    Refill:  0  . HYDROcodone-homatropine (HYCODAN) 5-1.5 MG/5ML syrup    Sig: Take 5 mLs by mouth every 6 (six) hours as needed for cough.    Dispense:  90 mL    Refill:  0   Be aware that the cough medication can make you feel sleepy. Ensure rest and adequate fluid intake.  If you are not improving over the next few days or feel you are worsening please follow up here or the Emergency Department if you are unable to see your regular doctor.  Reviewed expectations re: course of current medical issues. Questions answered. Outlined signs and symptoms indicating need for more acute intervention. Patient verbalized understanding. After Visit Summary given.   SUBJECTIVE:  Carly Taylor is a 20 y.o. female who presents with complaint of cough and wheezing. URI symptoms. For 2-3 days. No fevers. Chest tight. No SOB. No CP. OTC without relief. Normal PO intake.  ROS: As per HPI.   OBJECTIVE:  Vitals:   11/07/16 1322  BP: (!) 142/99  Pulse: (!) 104  Resp: (!) 22  Temp: 99.3 F (37.4 C)  SpO2: 98%    Recheck Pulse 90 RR 16  General appearance: alert; no distress Head: normocephalic; atraumatic Eyes: conjunctivae normal; EOMI Ears: normal TM's and external ear canals Nose: nasal congestion Throat: lips, mucosa, and tongue normal; teeth and gums normal Lungs: few expiratory wheezes; no resp distress Heart: regular rate and rhythm Skin: warm and dry; no rashes or lesions   Allergies  Allergen  Reactions  . Nsaids Other (See Comments)    Pt is unable to take this medication due to kidney disease.    . Nsaids Other (See Comments)    Hx of kidney disease    PMHx, SurgHx, SocialHx, Medications, and Allergies were reviewed in the Visit Navigator and updated as appropriate.      Vanessa Kick, MD 11/08/16 [phone]

## 2016-11-07 NOTE — Discharge Instructions (Addendum)
Today you were diagnosed with the following: 1. Viral URI with cough   2. Wheezing    You have been prescribed prescription medications this visit.   Meds ordered this encounter  Medications          albuterol (PROVENTIL HFA;VENTOLIN HFA) 108 (90 Base) MCG/ACT inhaler    Sig: Inhale 2 puffs into the lungs every 4 (four) hours as needed for wheezing or shortness of breath.    Dispense:  1 Inhaler    Refill:  0   HYDROcodone-homatropine (HYCODAN) 5-1.5 MG/5ML syrup    Sig: Take 5 mLs by mouth every 6 (six) hours as needed for cough.    Dispense:  90 mL    Refill:  0   Be aware that the cough medication can make you feel sleepy. Ensure rest and adequate fluid intake.  If you are not improving over the next few days or feel you are worsening please follow up here or the Emergency Department if you are unable to see your regular doctor.

## 2016-11-07 NOTE — ED Triage Notes (Signed)
Onset Monday of symptoms.  Complains of cough, runny nose and tight chest

## 2016-11-09 IMAGING — CR DG CHEST 2V
2 series · 2 of 2 positions shown · non-contrast
Comparison: 06/15/2015

CLINICAL DATA: Shortness of breath, chest tightness

EXAM:
CHEST  2 VIEW

[w chest pa]
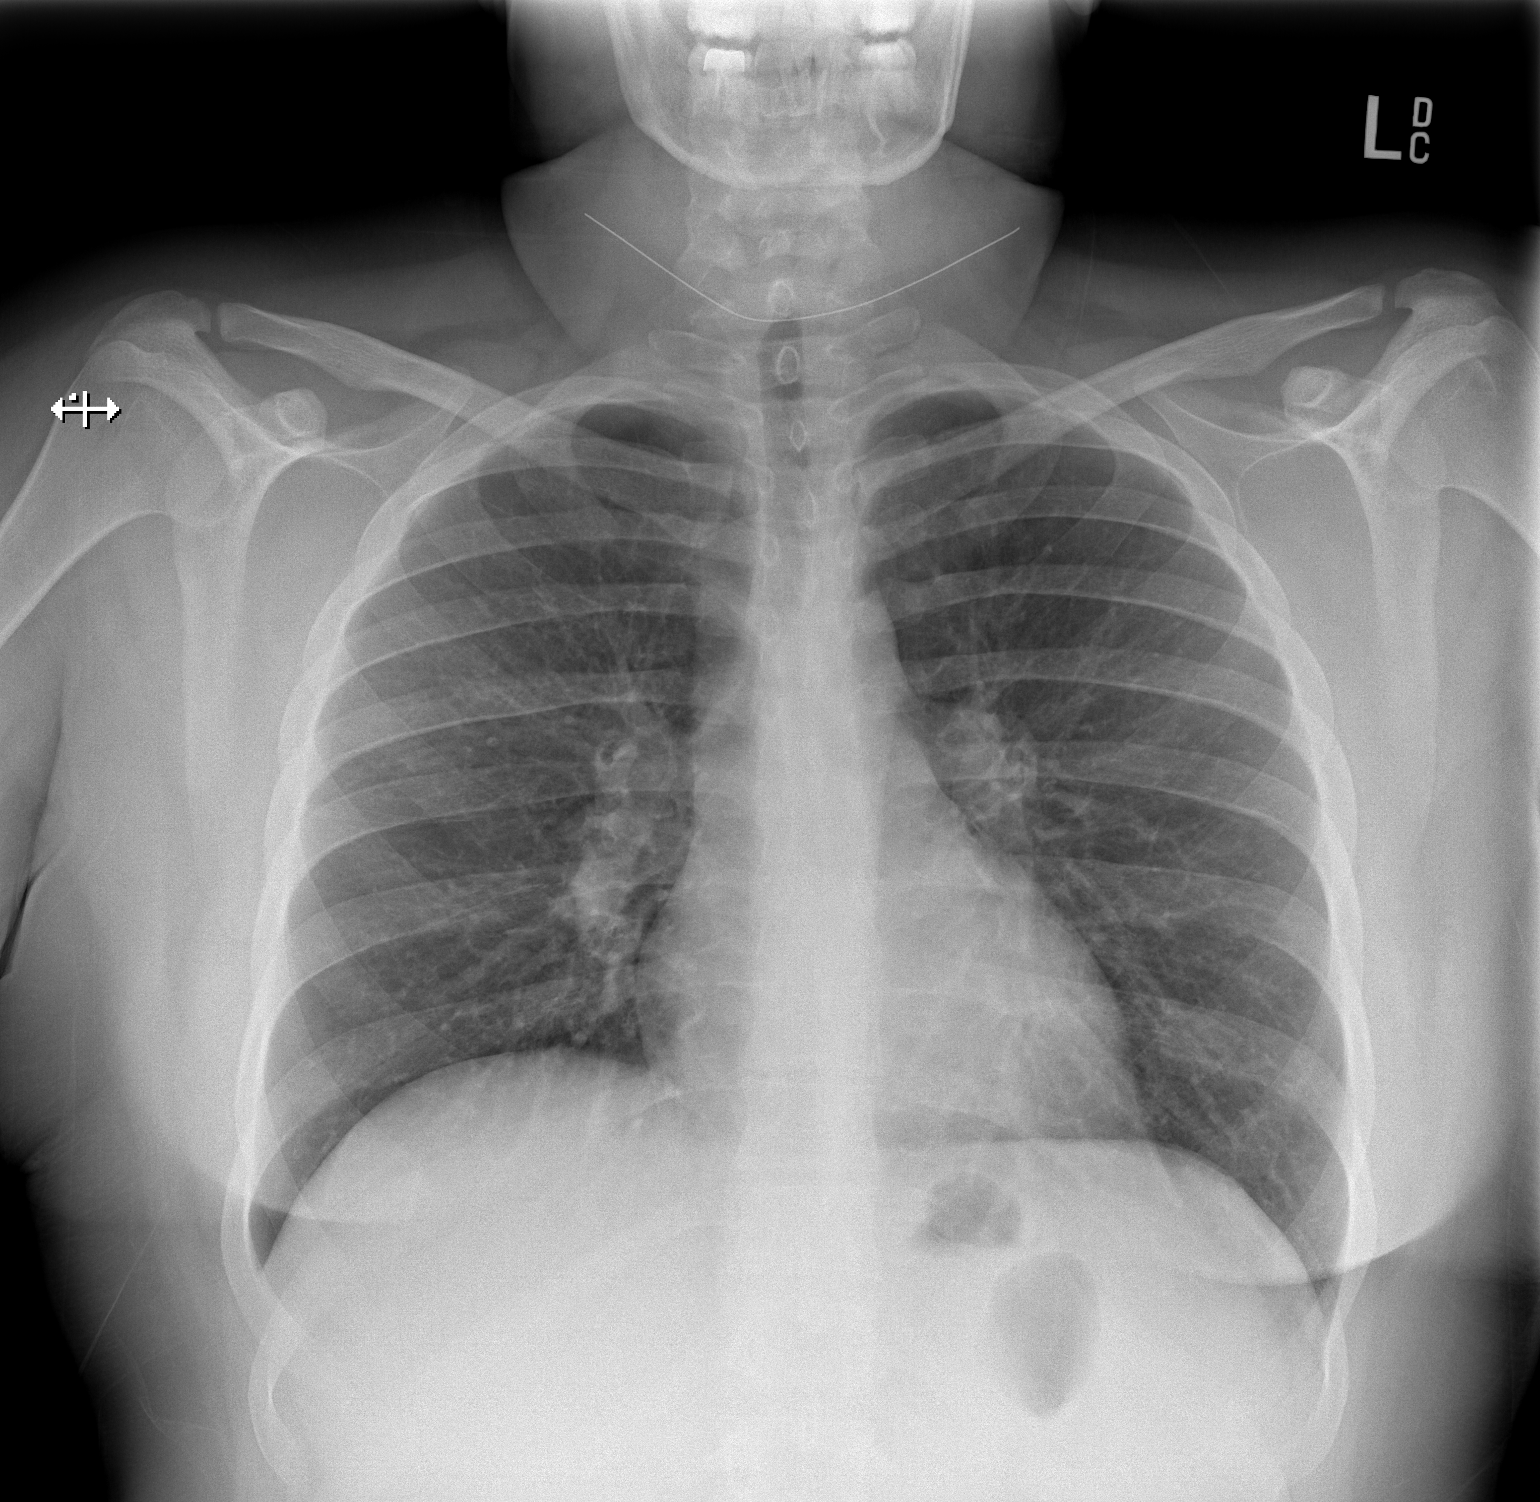

[w chest lat]
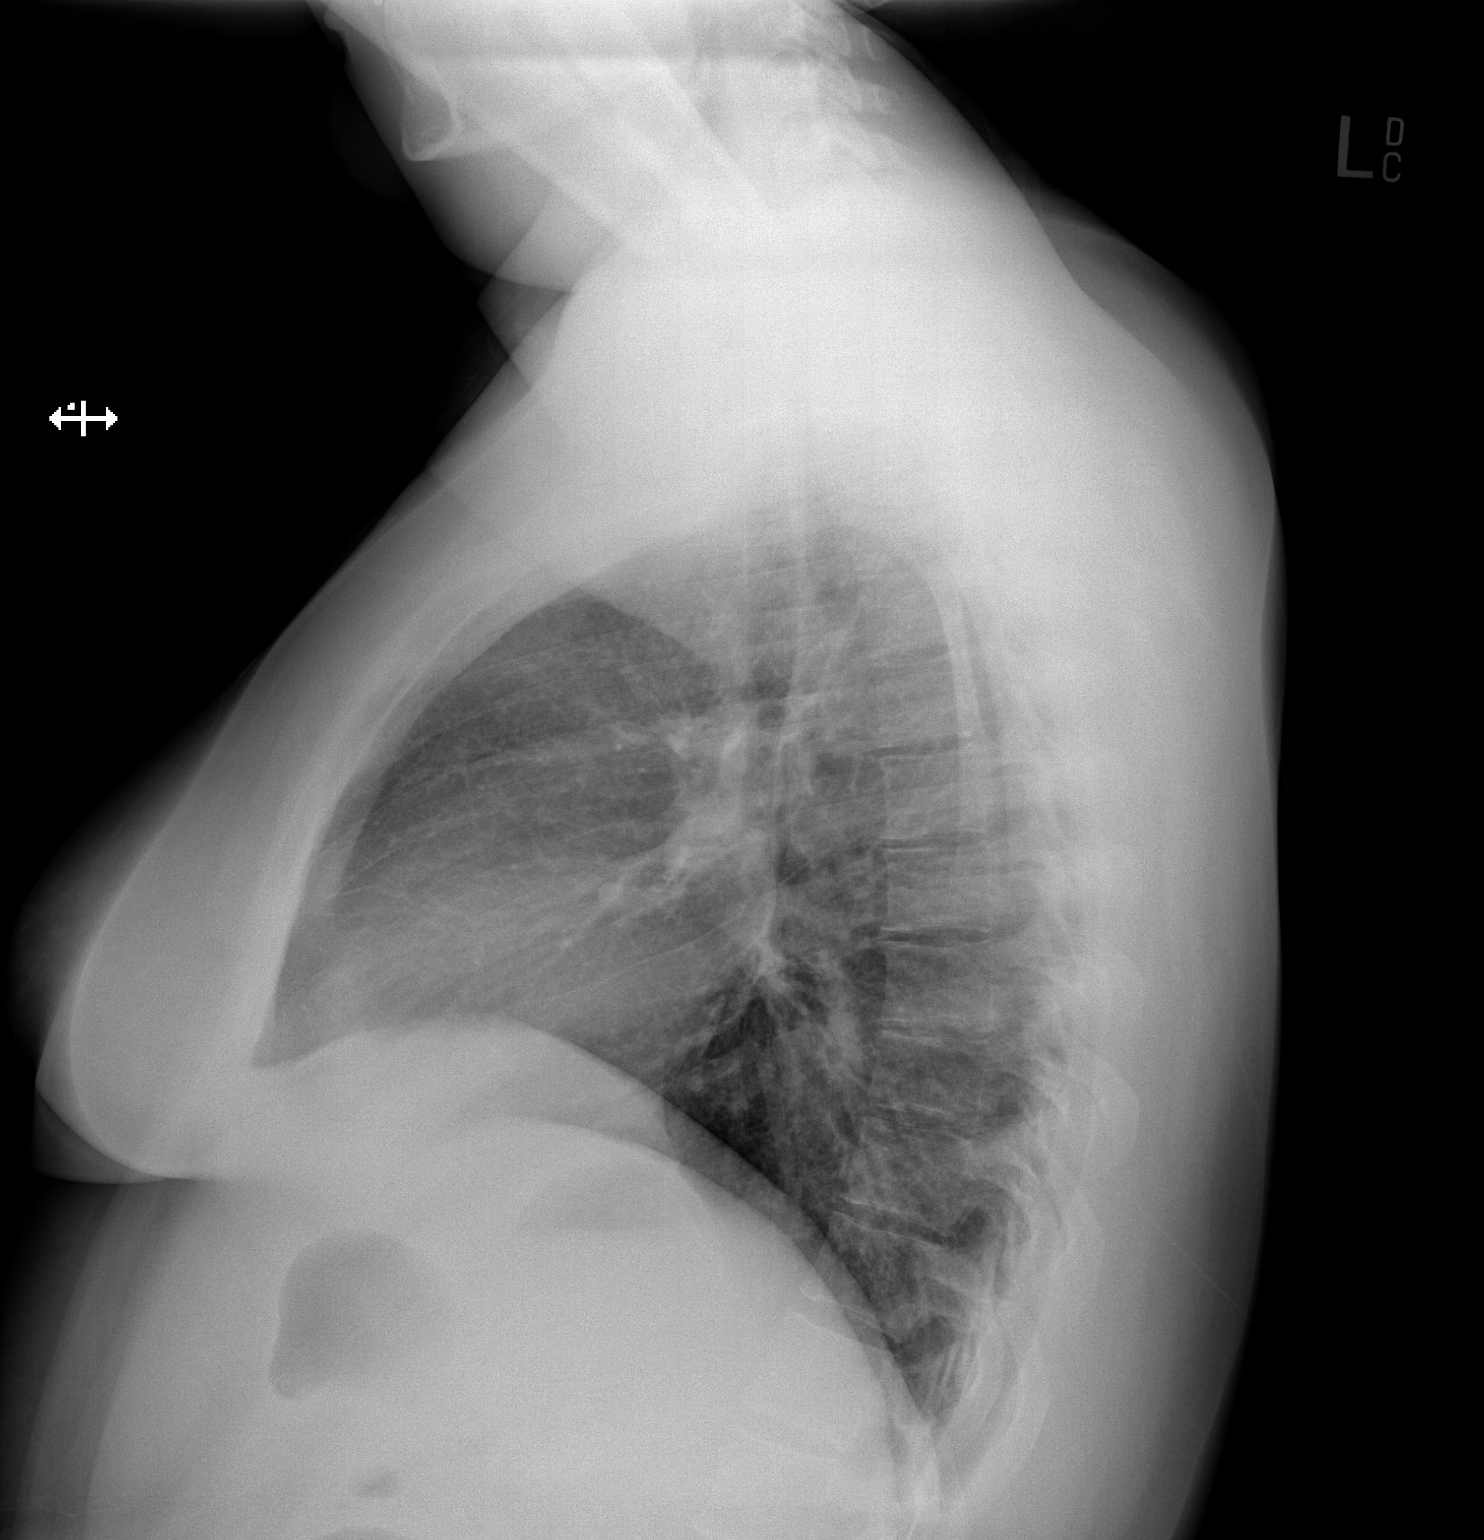

[2 of 2 positions shown; findings below may reference images not displayed]

FINDINGS: The heart size and mediastinal contours are within normal limits.
Both lungs are clear. The visualized skeletal structures are
unremarkable.
IMPRESSION: No active cardiopulmonary disease.

## 2016-11-15 NOTE — Addendum Note (Signed)
Addendum  created 11/15/16 1241 by Lyndle Herrlich, MD   Sign clinical note

## 2016-11-15 NOTE — Anesthesia Postprocedure Evaluation (Signed)
Anesthesia Post Note  Patient: Carly Taylor  Procedure(s) Performed: Procedure(s) (LRB): CESAREAN SECTION MULTI-GESTATIONAL (N/A)     Anesthesia Post Evaluation  Last Vitals:  Vitals:   09/14/16 0519 09/14/16 1035  BP: 139/78 129/84  Pulse: 76 98  Resp: 19   Temp: 36.9 C     Last Pain:  Vitals:   09/14/16 1025  TempSrc:   PainSc: 4                  Ermal Brzozowski EDWARD

## 2017-05-19 ENCOUNTER — Emergency Department (HOSPITAL_COMMUNITY): Payer: Self-pay

## 2017-05-19 ENCOUNTER — Encounter (HOSPITAL_COMMUNITY): Payer: Self-pay

## 2017-05-19 ENCOUNTER — Other Ambulatory Visit: Payer: Self-pay

## 2017-05-19 ENCOUNTER — Emergency Department (HOSPITAL_COMMUNITY)
Admission: EM | Admit: 2017-05-19 | Discharge: 2017-05-19 | Disposition: A | Discharge status: Home / Self Care | Payer: Self-pay | Attending: Emergency Medicine | Admitting: Emergency Medicine

## 2017-05-19 DIAGNOSIS — Z79899 Other long term (current) drug therapy: Secondary | ICD-10-CM | POA: Insufficient documentation

## 2017-05-19 DIAGNOSIS — J111 Influenza due to unidentified influenza virus with other respiratory manifestations: Secondary | ICD-10-CM | POA: Insufficient documentation

## 2017-05-19 DIAGNOSIS — F17210 Nicotine dependence, cigarettes, uncomplicated: Secondary | ICD-10-CM | POA: Insufficient documentation

## 2017-05-19 DIAGNOSIS — I10 Essential (primary) hypertension: Secondary | ICD-10-CM | POA: Insufficient documentation

## 2017-05-19 MED ORDER — GUAIFENESIN-DM 100-10 MG/5ML PO SYRP
5.0000 mL | ORAL_SOLUTION | ORAL | Status: DC | PRN
  Administered 2017-05-19: 5 mL via ORAL
  Filled 2017-05-19: qty 5

## 2017-05-19 MED ORDER — ACETAMINOPHEN 325 MG PO TABS
650.0000 mg | ORAL_TABLET | Freq: Once | ORAL | Status: AC
  Administered 2017-05-19: 650 mg via ORAL
  Filled 2017-05-19: qty 2

## 2017-05-19 NOTE — Discharge Instructions (Signed)
Try to drink 2 L of water each day.  Use Tylenol every 4 hours for fever.  Return here or see the doctor of your choice as needed for problems.

## 2017-05-19 NOTE — ED Triage Notes (Addendum)
Per Pt, Pt is coming from home with complaints of vomiting that started and stopped on Friday followed by a fever and painful cough that has continued since then. Denies productivity of cough.

## 2017-05-19 NOTE — ED Notes (Signed)
Patient transported to X-ray 

## 2017-05-19 NOTE — ED Notes (Signed)
Pt reports vomiting Friday only, states since she has had a fever with the highest being 103 and nonproductive cough.
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
## 2017-08-21 IMAGING — US US OB LIMITED
1 series · 14 of 28 positions shown · non-contrast
Comparison: none

CLINICAL DATA: Pelvic pain beginning this morning. Second trimester
pregnancy.

EXAM:
LIMITED OBSTETRIC ULTRASOUND

[Series 1: us ob limited · 0.28mm/px · 51 acquisitions, 14 frames shown]
[im 2/51]
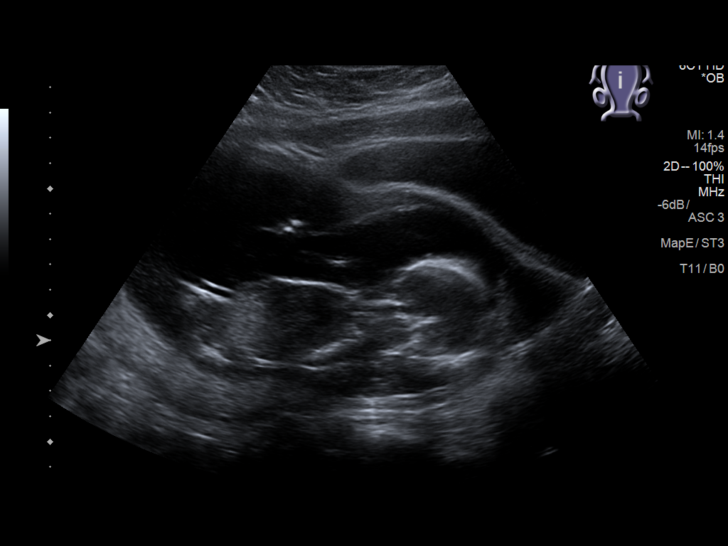
[im 6/51]
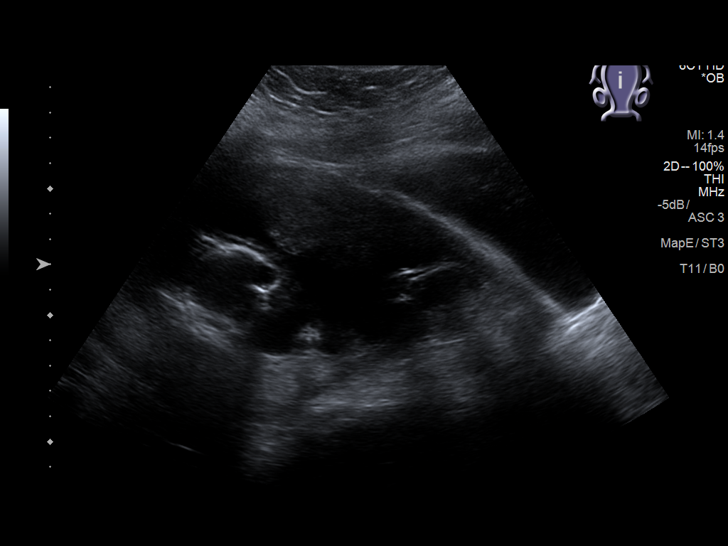
[im 10/51]
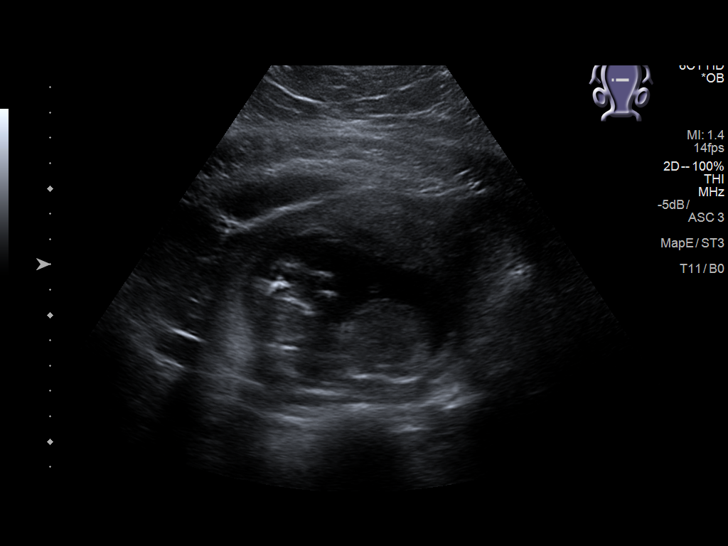
[im 13/51]
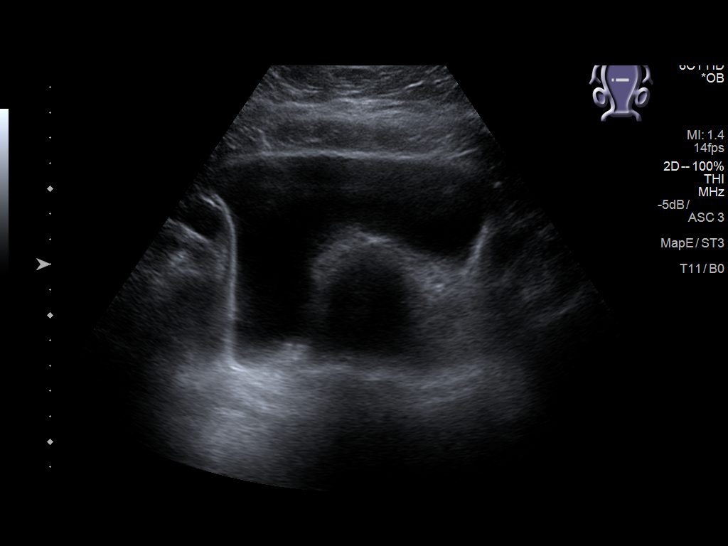
[im 17/51]
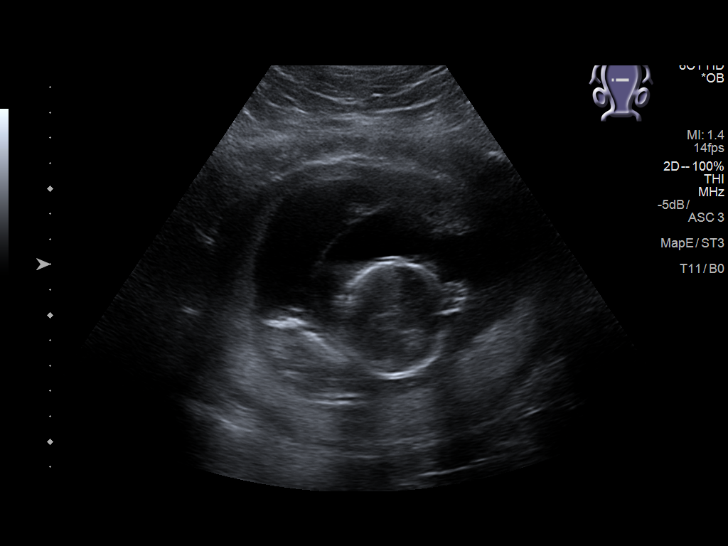
[im 21/51]
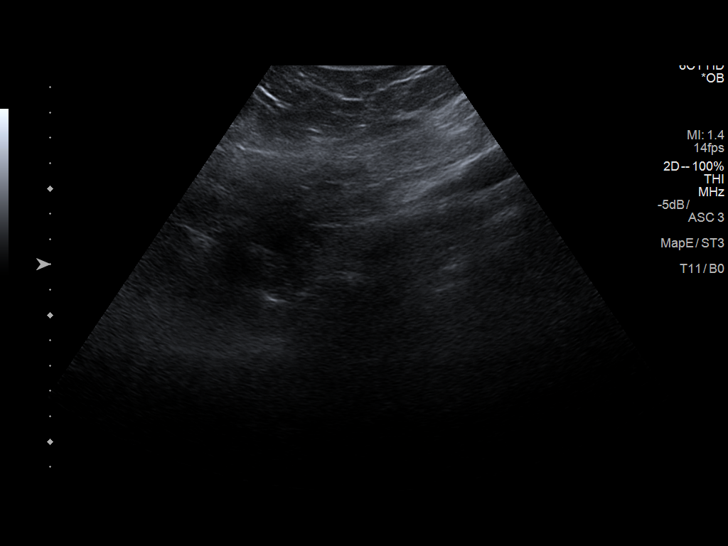
[im 25/51]
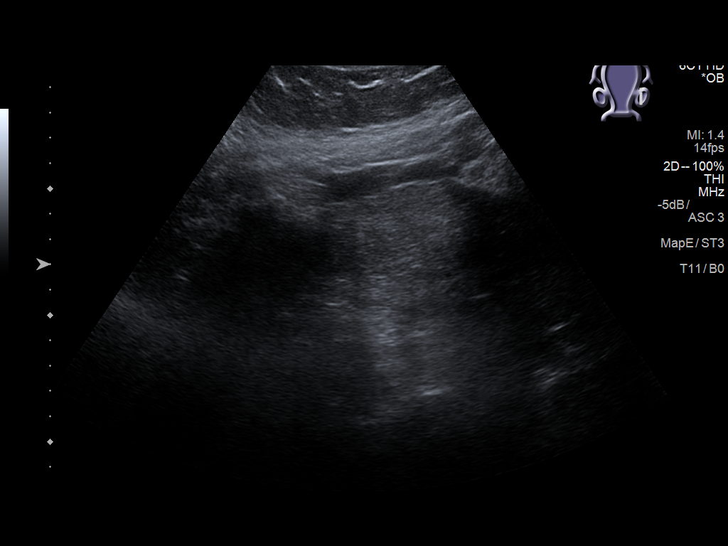
[im 28/51]
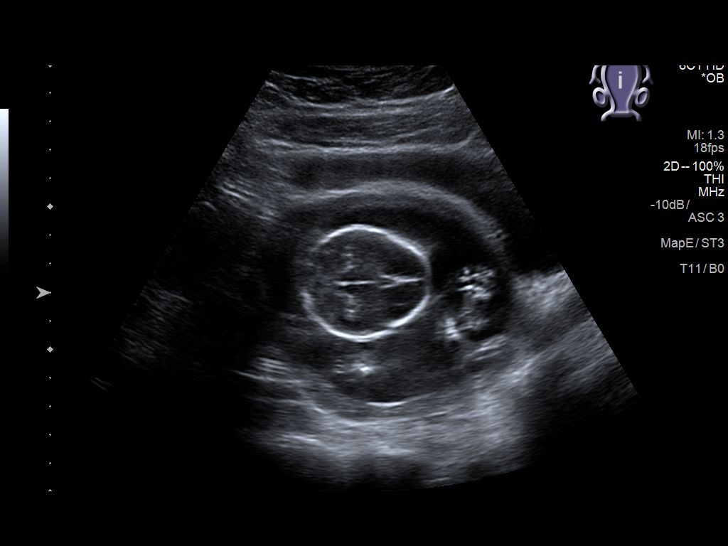
[im 32/51]
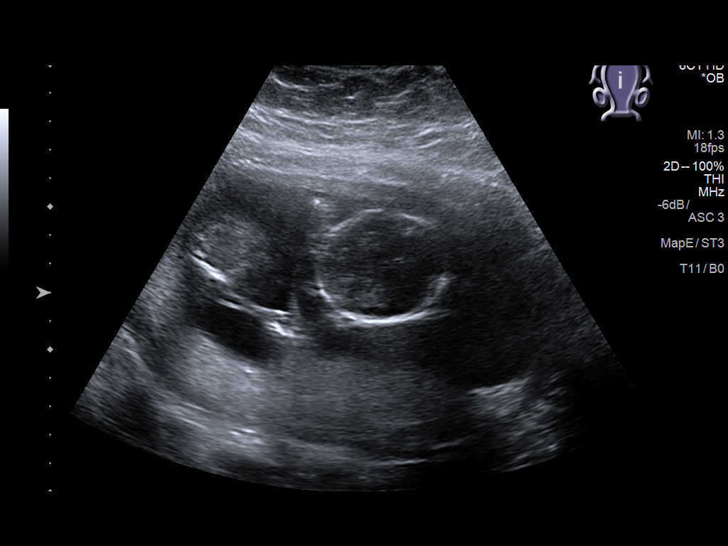
[im 36/51]
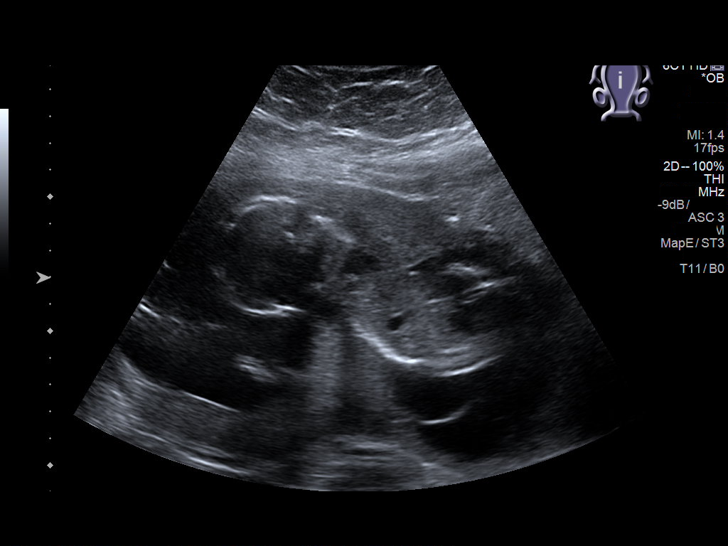
[im 39/51]
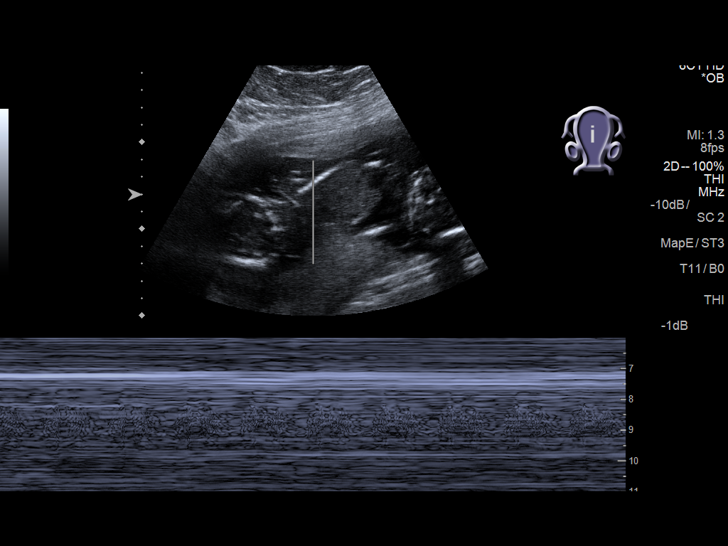
[im 43/51]
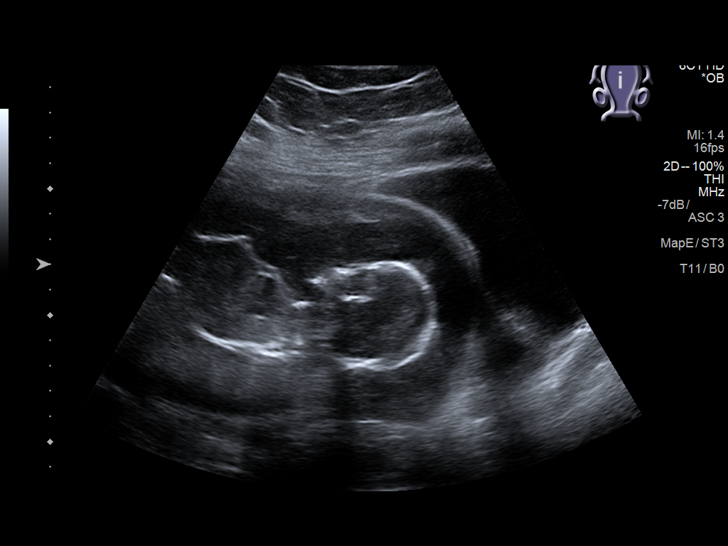
[im 47/51]
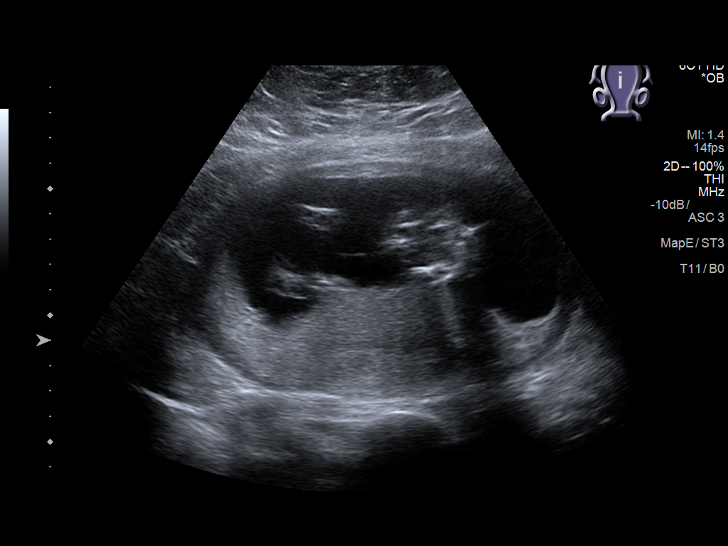
[im 51/51]
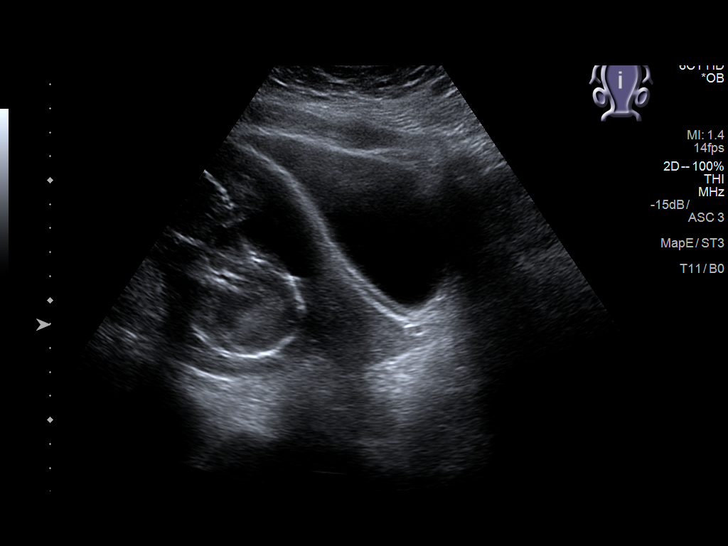

[14 of 28 positions shown; findings below may reference images not displayed]

FINDINGS: Number of Fetuses:  2

Separating Membrane: Visualized ; diamniotic and probable
dichorionic

TWIN 1

Heart Rate:  145 bpm

Movement: Yes

Presentation: Cephalic

Placental Location: Posterior

Previa: No

Amniotic Fluid (Subjective):  Within normal limits.

BPD:  3.8cm 17w  5d

TWIN 2

Heart Rate:  144 bpm

Movement: Yes

Presentation: Breech

Placental Location: Posterior

Previa: No

Amniotic Fluid (Subjective): Within normal limits.

BPD:  3.8cm 17w  5d

MATERNAL FINDINGS:

Cervix: Appears closed. Ovaries not directly visualized, however no
adnexal mass or free fluid identified.

Uterus/Adnexae: No abnormality visualized.
IMPRESSION: Living twin IUP, which is diamniotic and probably dichorionic.
Nonemergent complete OB ultrasound examination is recommended for
further evaluation.

No acute maternal findings visualized.

This exam is performed on an emergent basis and does not
comprehensively evaluate fetal size, dating, or anatomy.

## 2017-08-21 IMAGING — US US ABDOMEN COMPLETE
1 series · 13 of 25 positions shown · non-contrast
Comparison: CT abdomen and pelvis August 26, 2013

CLINICAL DATA: Abdominal pain

EXAM:
ABDOMEN ULTRASOUND COMPLETE

[Series 1: us abdomen complete · 0.22mm/px · 13 of 100 slices shown]
[im 1/100]
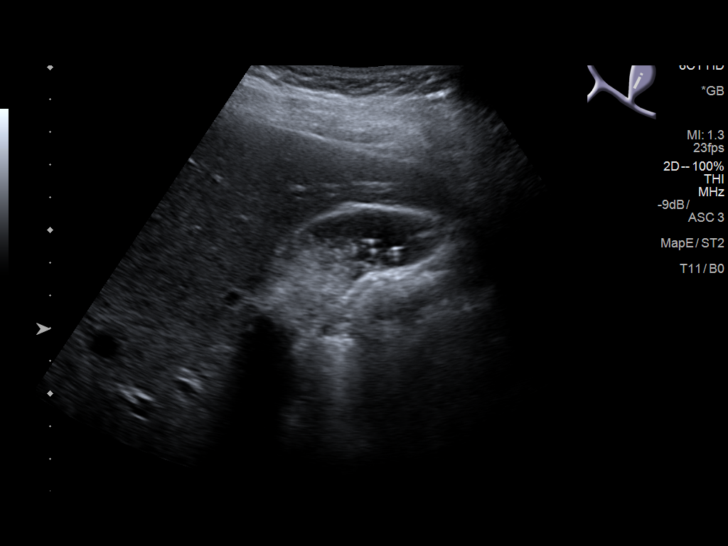
[im 9/100]
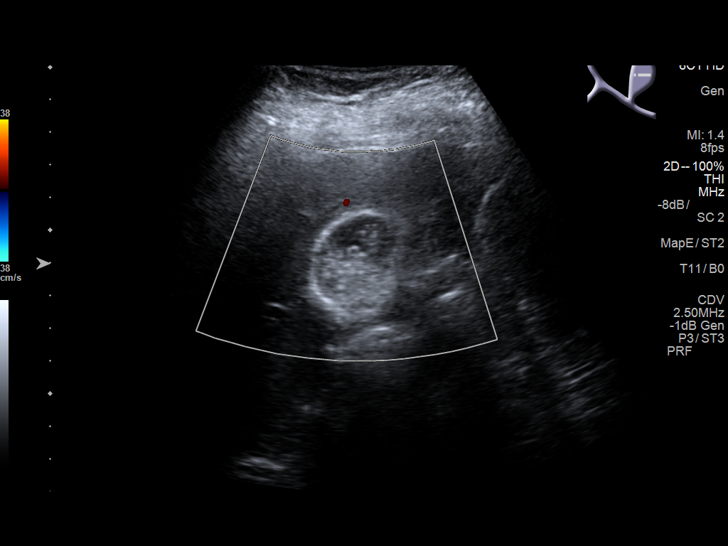
[im 17/100]
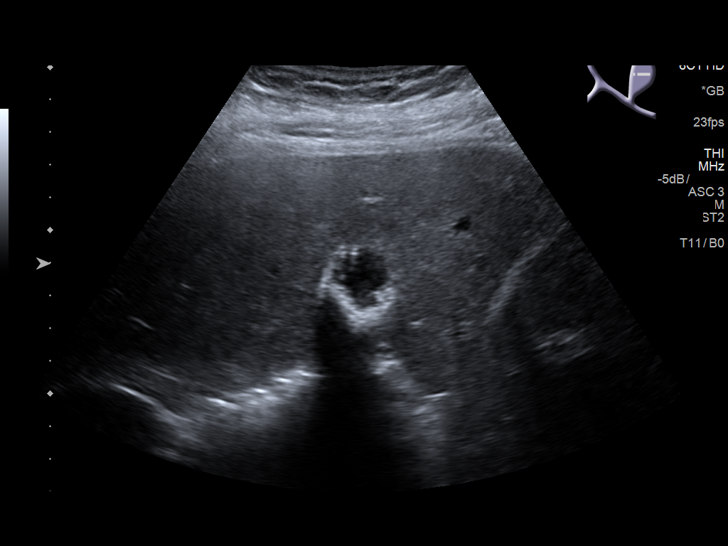
[im 25/100]
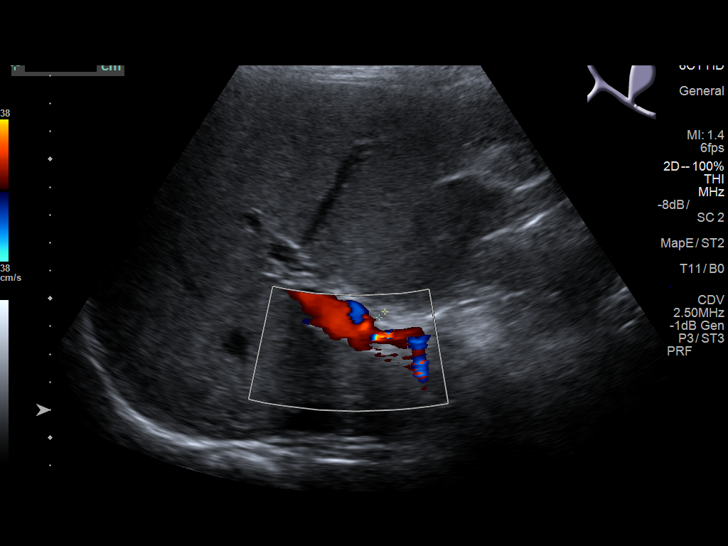
[im 34/100]
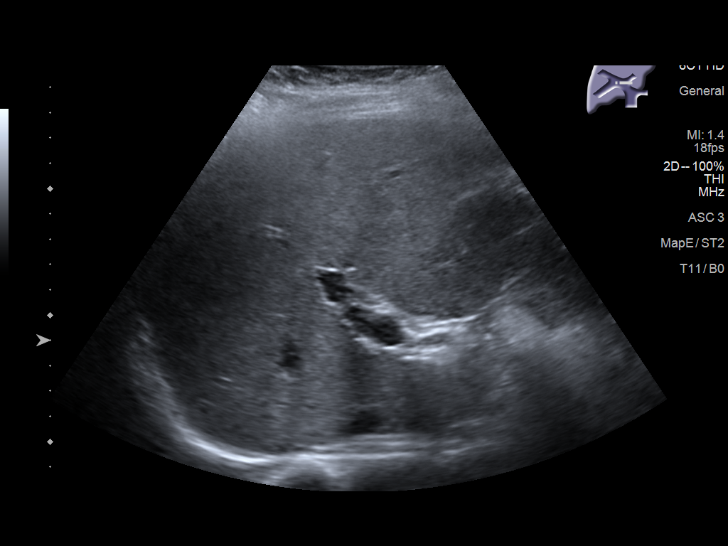
[im 42/100]
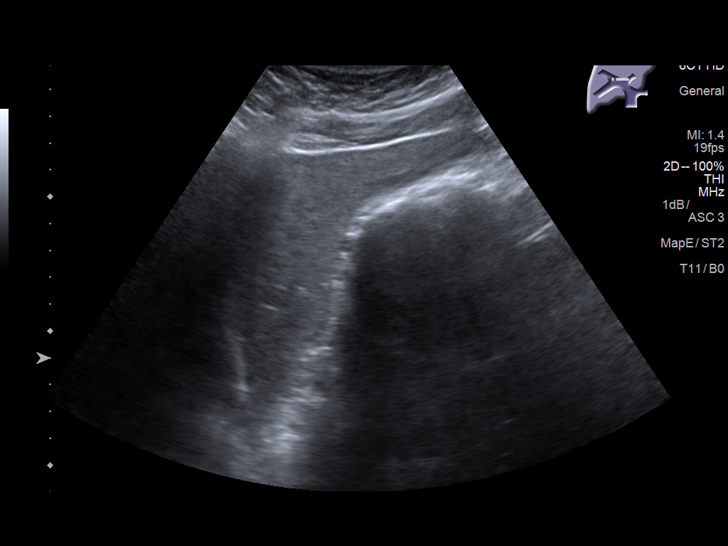
[im 50/100]
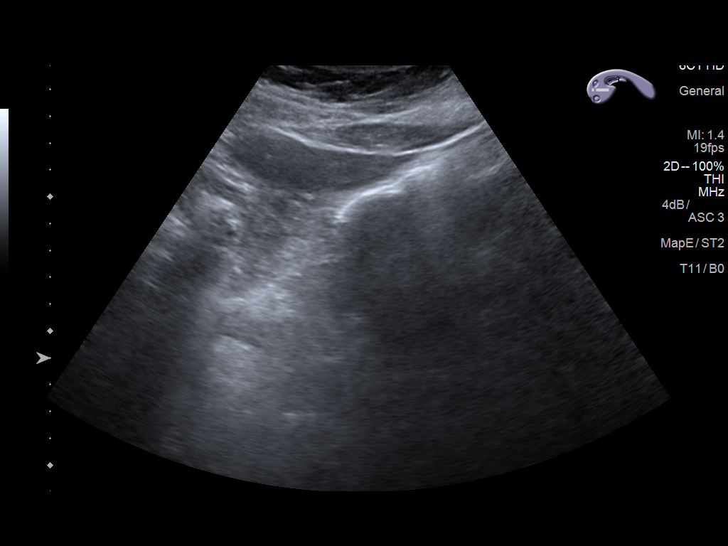
[im 58/100]
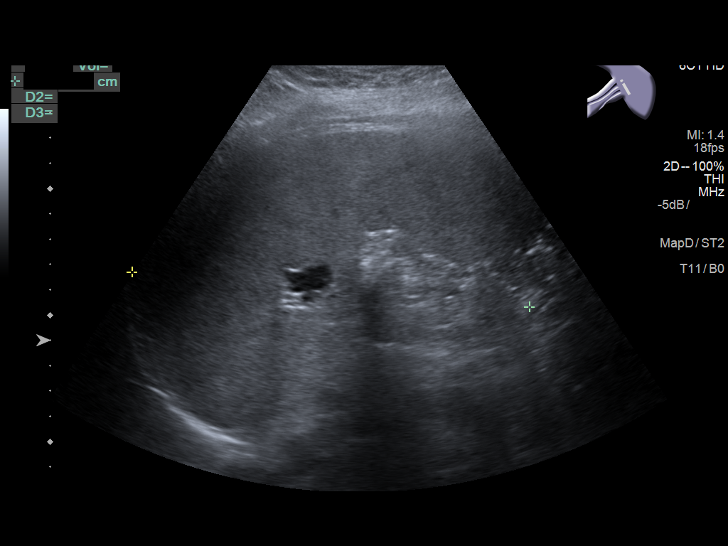
[im 67/100]
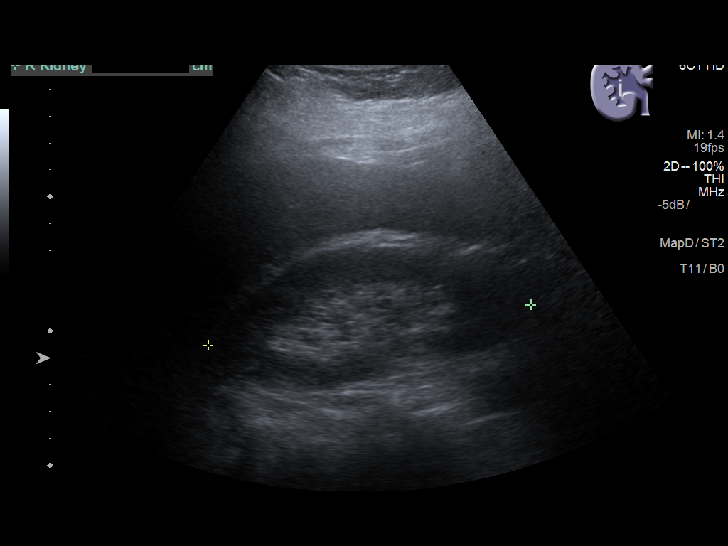
[im 75/100]
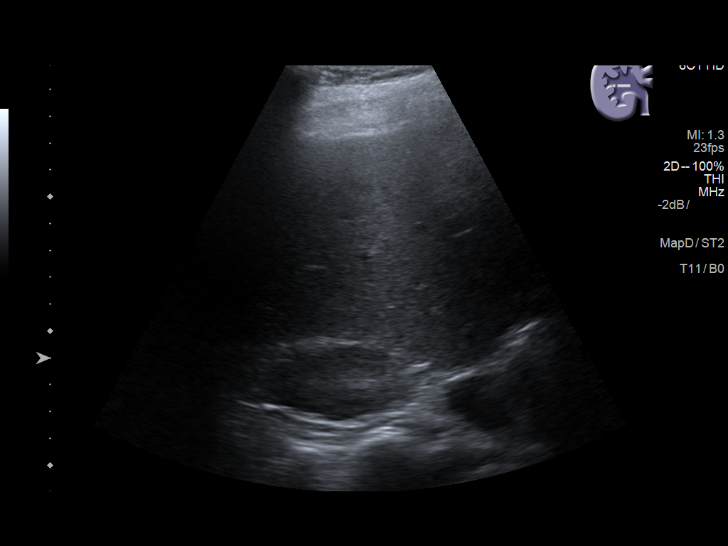
[im 83/100]
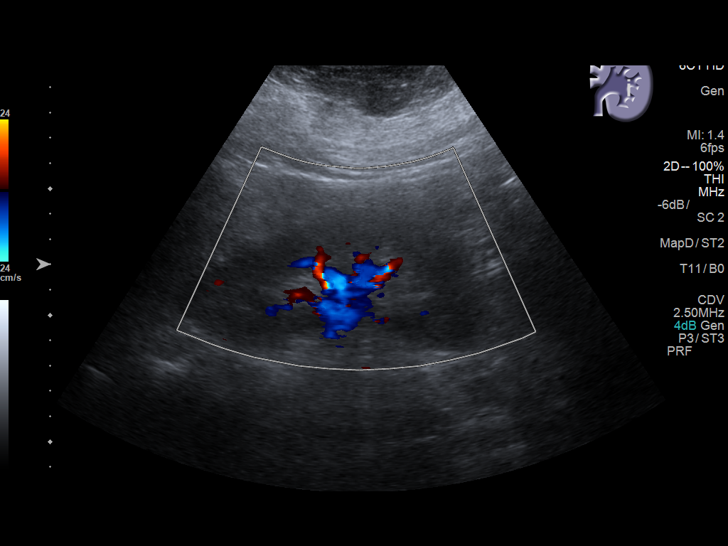
[im 91/100]
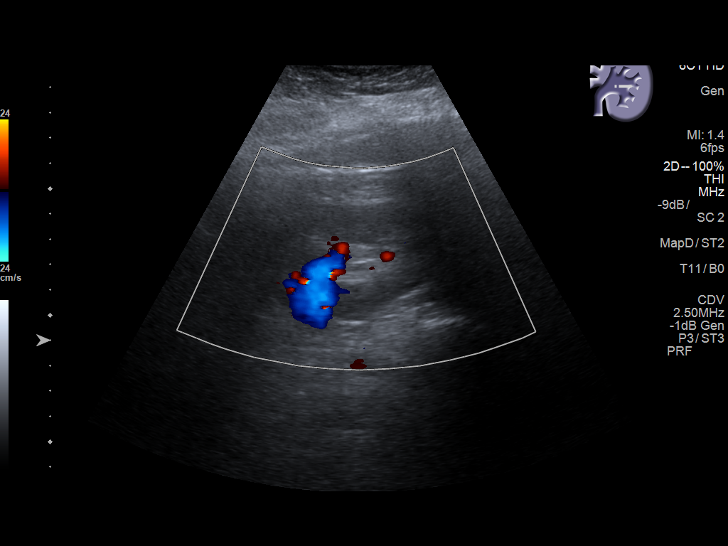
[im 100/100]
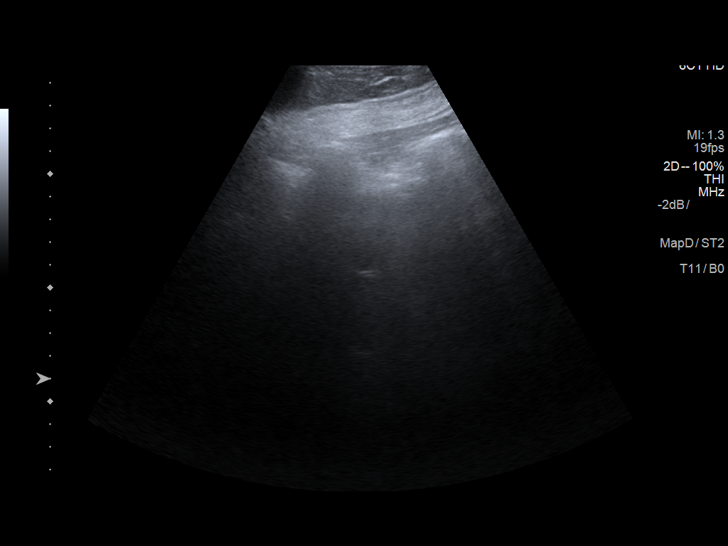

[13 of 25 positions shown; findings below may reference images not displayed]

FINDINGS: Gallbladder: There are multiple echogenic foci throughout the
gallbladder which move and shadow consistent with gallstones. All
gallstones measure less than 5 mm in length. There is no gallbladder
wall thickening or pericholecystic fluid. No sonographic Murphy sign
noted by sonographer.

Common bile duct: Diameter: 3 mm. There is no intrahepatic, common
hepatic, or common bile duct dilatation.

Liver: No focal lesion identified. Within normal limits in
parenchymal echogenicity.

IVC: No abnormality visualized.

Pancreas: Essentially completely obscured by gas.

Spleen: Spleen measures 15.8 x 5.9 x 16.4 cm with a measured splenic
volume of 797 cubic cm. No focal splenic lesions are evident.

Right Kidney: Length: 12.1 cm. Echogenicity within normal limits. No
mass or hydronephrosis visualized.

Left Kidney: Length: 12.5 cm. Echogenicity within normal limits. No
mass or hydronephrosis visualized.

Abdominal aorta: No aneurysm visualized. Note that portions of the
aorta are obscured by gas.

Other findings: No demonstrable ascites.
IMPRESSION: Cholelithiasis. No gallbladder wall thickening or pericholecystic
fluid.

Splenomegaly.  No focal splenic lesions.

Pancreas obscured by gas.  Portions of aorta obscured by gas.

Study otherwise unremarkable.

## 2017-12-07 DIAGNOSIS — Z79899 Other long term (current) drug therapy: Secondary | ICD-10-CM | POA: Insufficient documentation

## 2017-12-07 DIAGNOSIS — I10 Essential (primary) hypertension: Secondary | ICD-10-CM | POA: Insufficient documentation

## 2017-12-07 DIAGNOSIS — F17200 Nicotine dependence, unspecified, uncomplicated: Secondary | ICD-10-CM | POA: Insufficient documentation

## 2017-12-07 DIAGNOSIS — J40 Bronchitis, not specified as acute or chronic: Secondary | ICD-10-CM | POA: Insufficient documentation

## 2017-12-07 DIAGNOSIS — J040 Acute laryngitis: Secondary | ICD-10-CM | POA: Insufficient documentation

## 2017-12-08 ENCOUNTER — Other Ambulatory Visit: Payer: Self-pay

## 2017-12-08 ENCOUNTER — Encounter: Payer: Self-pay | Admitting: Emergency Medicine

## 2017-12-08 ENCOUNTER — Emergency Department: Payer: Self-pay

## 2017-12-08 ENCOUNTER — Emergency Department
Admission: EM | Admit: 2017-12-08 | Discharge: 2017-12-08 | Disposition: A | Discharge status: Home / Self Care | Payer: Self-pay | Attending: Emergency Medicine | Admitting: Emergency Medicine

## 2017-12-08 DIAGNOSIS — J040 Acute laryngitis: Secondary | ICD-10-CM

## 2017-12-08 DIAGNOSIS — J40 Bronchitis, not specified as acute or chronic: Secondary | ICD-10-CM

## 2017-12-08 MED ORDER — LIDOCAINE HCL (PF) 1 % IJ SOLN
5.0000 mL | Freq: Once | INTRAMUSCULAR | Status: AC
  Administered 2017-12-08: 5 mL via INTRADERMAL
  Filled 2017-12-08: qty 5

## 2017-12-08 MED ORDER — IPRATROPIUM-ALBUTEROL 0.5-2.5 (3) MG/3ML IN SOLN
3.0000 mL | Freq: Once | RESPIRATORY_TRACT | Status: AC
  Administered 2017-12-08: 3 mL via RESPIRATORY_TRACT
  Filled 2017-12-08: qty 3

## 2017-12-08 MED ORDER — BENZONATATE 100 MG PO CAPS: 100.0000 mg | ORAL_CAPSULE | Freq: Four times a day (QID) | ORAL | 0 refills | Status: AC | PRN

## 2017-12-08 MED ORDER — BENZONATATE 100 MG PO CAPS: 100.0000 mg | ORAL_CAPSULE | Freq: Four times a day (QID) | ORAL | 0 refills | Status: DC | PRN

## 2017-12-08 MED ORDER — ALBUTEROL SULFATE (2.5 MG/3ML) 0.083% IN NEBU
5.0000 mg | INHALATION_SOLUTION | Freq: Once | RESPIRATORY_TRACT | Status: AC
  Administered 2017-12-08: 5 mg via RESPIRATORY_TRACT
  Filled 2017-12-08: qty 6

## 2017-12-08 MED ORDER — BENZONATATE 100 MG PO CAPS
200.0000 mg | ORAL_CAPSULE | Freq: Once | ORAL | Status: AC
  Administered 2017-12-08: 200 mg via ORAL
  Filled 2017-12-08: qty 2

## 2017-12-08 MED ORDER — OXYMETAZOLINE HCL 0.05 % NA SOLN
1.0000 | Freq: Once | NASAL | Status: AC
  Administered 2017-12-08: 1 via NASAL
  Filled 2017-12-08: qty 15

## 2017-12-08 NOTE — ED Notes (Signed)
Patient reports symptoms for the past week.  Reports started with cough, then lost her voice.  Reports coughs so hard she vomits.  States about 1 hour after eating she starts to cough and then vomits everything she ate.  Patient reports tonight while vomiting saw blood.

## 2017-12-08 NOTE — Discharge Instructions (Signed)
It was a pleasure to take care of you today, and thank you for coming to our emergency department.  If you have any questions or concerns before leaving please ask the nurse to grab me and I'm more than happy to go through your aftercare instructions again.  If you were prescribed any opioid pain medication today such as Norco, Vicodin, Percocet, morphine, hydrocodone, or oxycodone please make sure you do not drive when you are taking this medication as it can alter your ability to drive safely.  If you have any concerns once you are home that you are not improving or are in fact getting worse before you can make it to your follow-up appointment, please do not hesitate to call [phone] and come back for further evaluation.  Darel Hong, MD  Results for orders placed or performed during the hospital encounter of 09/11/16  CBC  Result Value Ref Range   WBC 9.2 4.0 - 10.5 K/uL   RBC 4.02 3.87 - 5.11 MIL/uL   Hemoglobin 12.3 12.0 - 15.0 g/dL   HCT 35.6 (L) 36.0 - 46.0 %   MCV 88.6 78.0 - 100.0 fL   MCH 30.6 26.0 - 34.0 pg   MCHC 34.6 30.0 - 36.0 g/dL   RDW 16.5 (H) 11.5 - 15.5 %   Platelets 213 150 - 400 K/uL  RPR  Result Value Ref Range   RPR Ser Ql Non Reactive Non Reactive  CBC  Result Value Ref Range   WBC 8.5 4.0 - 10.5 K/uL   RBC 3.15 (L) 3.87 - 5.11 MIL/uL   Hemoglobin 9.7 (L) 12.0 - 15.0 g/dL   HCT 28.2 (L) 36.0 - 46.0 %   MCV 89.5 78.0 - 100.0 fL   MCH 30.8 26.0 - 34.0 pg   MCHC 34.4 30.0 - 36.0 g/dL   RDW 17.1 (H) 11.5 - 15.5 %   Platelets 161 150 - 400 K/uL   Dg Chest 2 View  Result Date: 12/08/2017 CLINICAL DATA:  Acute onset of cough. Vomiting. Fever. Sinus congestion. EXAM: CHEST - 2 VIEW COMPARISON:  Chest radiograph performed 05/19/2017 FINDINGS: The lungs are well-aerated and clear. There is no evidence of focal opacification, pleural effusion or pneumothorax. The heart is normal in size; the mediastinal contour is within normal limits. No acute osseous abnormalities  are seen. IMPRESSION: No acute cardiopulmonary process seen. Electronically Signed   By: Garald Balding M.D.   On: 12/08/2017 00:41

## 2017-12-08 NOTE — ED Triage Notes (Signed)
Pt states feeling bad x 1 week with cough, sometimes until she vomits; last fever was yesterday; sinus congestion; voice hoarse; using inhaler today with no relief;
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
## 2017-12-08 NOTE — ED Notes (Signed)
Visitor to stat desk asking about wait time. Visitor given update. Visitor verbalizes understanding.

## 2019-04-14 DIAGNOSIS — Z5181 Encounter for therapeutic drug level monitoring: Secondary | ICD-10-CM | POA: Diagnosis not present

## 2019-04-21 DIAGNOSIS — Z5181 Encounter for therapeutic drug level monitoring: Secondary | ICD-10-CM | POA: Diagnosis not present

## 2019-05-18 DIAGNOSIS — Z5181 Encounter for therapeutic drug level monitoring: Secondary | ICD-10-CM | POA: Diagnosis not present

## 2019-06-01 DIAGNOSIS — Z5181 Encounter for therapeutic drug level monitoring: Secondary | ICD-10-CM | POA: Diagnosis not present

## 2019-06-08 DIAGNOSIS — Z5181 Encounter for therapeutic drug level monitoring: Secondary | ICD-10-CM | POA: Diagnosis not present

## 2019-06-15 DIAGNOSIS — Z5181 Encounter for therapeutic drug level monitoring: Secondary | ICD-10-CM | POA: Diagnosis not present

## 2019-06-16 DIAGNOSIS — I129 Hypertensive chronic kidney disease with stage 1 through stage 4 chronic kidney disease, or unspecified chronic kidney disease: Secondary | ICD-10-CM | POA: Diagnosis not present

## 2019-06-16 DIAGNOSIS — D649 Anemia, unspecified: Secondary | ICD-10-CM | POA: Diagnosis not present

## 2019-06-16 DIAGNOSIS — N028 Recurrent and persistent hematuria with other morphologic changes: Secondary | ICD-10-CM | POA: Diagnosis not present

## 2019-06-16 DIAGNOSIS — N289 Disorder of kidney and ureter, unspecified: Secondary | ICD-10-CM | POA: Diagnosis not present

## 2019-06-22 DIAGNOSIS — Z5181 Encounter for therapeutic drug level monitoring: Secondary | ICD-10-CM | POA: Diagnosis not present

## 2019-06-30 DIAGNOSIS — N028 Recurrent and persistent hematuria with other morphologic changes: Secondary | ICD-10-CM | POA: Diagnosis not present

## 2019-06-30 DIAGNOSIS — D649 Anemia, unspecified: Secondary | ICD-10-CM | POA: Diagnosis not present

## 2019-07-28 DIAGNOSIS — I129 Hypertensive chronic kidney disease with stage 1 through stage 4 chronic kidney disease, or unspecified chronic kidney disease: Secondary | ICD-10-CM | POA: Diagnosis not present

## 2019-07-28 DIAGNOSIS — N028 Recurrent and persistent hematuria with other morphologic changes: Secondary | ICD-10-CM | POA: Diagnosis not present

## 2019-10-19 DIAGNOSIS — H5213 Myopia, bilateral: Secondary | ICD-10-CM | POA: Diagnosis not present

## 2019-10-25 ENCOUNTER — Telehealth: Payer: Self-pay | Admitting: Family Medicine

## 2019-10-25 NOTE — Telephone Encounter (Signed)
Call to client and phone rang ~ 10 times, then busy signal heard x1, then could hear phone ringing again. No answer / no voicemail. Rich Number, RN

## 2019-10-26 NOTE — Telephone Encounter (Signed)
Attempted phone call to patient at provided  number. Per phone message, "number has been disconnected." Hal Morales, RN

## 2019-10-27 NOTE — Telephone Encounter (Signed)
Phone call to pt, tried both numbers. Home number was disconnected the cell phone  had a completely different name on the voicemail, did not leave message.

## 2019-11-01 DIAGNOSIS — H5213 Myopia, bilateral: Secondary | ICD-10-CM | POA: Diagnosis not present

## 2019-11-04 DIAGNOSIS — Z419 Encounter for procedure for purposes other than remedying health state, unspecified: Secondary | ICD-10-CM | POA: Diagnosis not present

## 2019-12-05 DIAGNOSIS — Z419 Encounter for procedure for purposes other than remedying health state, unspecified: Secondary | ICD-10-CM | POA: Diagnosis not present

## 2020-01-05 DIAGNOSIS — Z419 Encounter for procedure for purposes other than remedying health state, unspecified: Secondary | ICD-10-CM | POA: Diagnosis not present

## 2020-02-04 DIAGNOSIS — Z419 Encounter for procedure for purposes other than remedying health state, unspecified: Secondary | ICD-10-CM | POA: Diagnosis not present

## 2020-02-24 DIAGNOSIS — R21 Rash and other nonspecific skin eruption: Secondary | ICD-10-CM | POA: Diagnosis not present

## 2020-02-24 DIAGNOSIS — N028 Recurrent and persistent hematuria with other morphologic changes: Secondary | ICD-10-CM | POA: Diagnosis not present

## 2020-03-06 DIAGNOSIS — Z419 Encounter for procedure for purposes other than remedying health state, unspecified: Secondary | ICD-10-CM | POA: Diagnosis not present

## 2020-04-05 DIAGNOSIS — Z419 Encounter for procedure for purposes other than remedying health state, unspecified: Secondary | ICD-10-CM | POA: Diagnosis not present

## 2020-04-16 ENCOUNTER — Ambulatory Visit
Admission: EM | Admit: 2020-04-16 | Discharge: 2020-04-16 | Disposition: A | Discharge status: Home / Self Care | Payer: Medicaid Other | Attending: Physician Assistant | Admitting: Physician Assistant

## 2020-04-16 ENCOUNTER — Encounter: Payer: Self-pay | Admitting: Emergency Medicine

## 2020-04-16 ENCOUNTER — Other Ambulatory Visit: Payer: Self-pay

## 2020-04-16 DIAGNOSIS — F17210 Nicotine dependence, cigarettes, uncomplicated: Secondary | ICD-10-CM | POA: Diagnosis not present

## 2020-04-16 DIAGNOSIS — R062 Wheezing: Secondary | ICD-10-CM | POA: Diagnosis not present

## 2020-04-16 DIAGNOSIS — R059 Cough, unspecified: Secondary | ICD-10-CM | POA: Diagnosis not present

## 2020-04-16 DIAGNOSIS — N028 Recurrent and persistent hematuria with other morphologic changes: Secondary | ICD-10-CM | POA: Diagnosis not present

## 2020-04-16 DIAGNOSIS — Z20822 Contact with and (suspected) exposure to covid-19: Secondary | ICD-10-CM | POA: Diagnosis not present

## 2020-04-16 LAB — RESP PANEL BY RT-PCR (FLU A&B, COVID) ARPGX2
Influenza A by PCR: NEGATIVE
Influenza B by PCR: NEGATIVE
SARS Coronavirus 2 by RT PCR: NEGATIVE

## 2020-04-16 MED ORDER — ALBUTEROL SULFATE HFA 108 (90 BASE) MCG/ACT IN AERS: 2.0000 | INHALATION_SPRAY | RESPIRATORY_TRACT | 0 refills | Status: AC | PRN

## 2020-04-16 MED ORDER — PREDNISONE 20 MG PO TABS: 40.0000 mg | ORAL_TABLET | Freq: Every day | ORAL | 0 refills | Status: AC

## 2020-04-16 MED ORDER — ALBUTEROL SULFATE (5 MG/ML) 0.5% IN NEBU: 2.5000 mg | INHALATION_SOLUTION | Freq: Four times a day (QID) | RESPIRATORY_TRACT | 0 refills | Status: AC | PRN
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
## 2020-04-16 NOTE — ED Triage Notes (Signed)
Patient c/o runny nose, cough, chest congestion, SOB and tightness in chest that started on Friday.  Patient denies fevers.

## 2020-04-16 NOTE — Discharge Instructions (Addendum)
Please take your blood pressure medication today.  Please follow up with your nephrologist for recheck of your BP.  Decrease to quit smoking to prevent exacerbations of similar episodes.  5 days of prednisone.  Use of inhaler or nebulizer as needed for wheezing or shortness of breath.     If symptoms worsen or do not improve in the next week to return to be seen or to follow up with your PCP.

## 2020-05-06 DIAGNOSIS — Z419 Encounter for procedure for purposes other than remedying health state, unspecified: Secondary | ICD-10-CM | POA: Diagnosis not present

## 2020-05-10 ENCOUNTER — Other Ambulatory Visit: Payer: Self-pay

## 2020-05-10 ENCOUNTER — Ambulatory Visit
Admission: EM | Admit: 2020-05-10 | Discharge: 2020-05-10 | Disposition: A | Discharge status: Home / Self Care | Payer: Medicaid Other | Attending: Sports Medicine | Admitting: Sports Medicine

## 2020-05-10 DIAGNOSIS — M791 Myalgia, unspecified site: Secondary | ICD-10-CM | POA: Diagnosis not present

## 2020-05-10 DIAGNOSIS — J069 Acute upper respiratory infection, unspecified: Secondary | ICD-10-CM | POA: Insufficient documentation

## 2020-05-10 DIAGNOSIS — Z20822 Contact with and (suspected) exposure to covid-19: Secondary | ICD-10-CM | POA: Insufficient documentation

## 2020-05-10 DIAGNOSIS — R509 Fever, unspecified: Secondary | ICD-10-CM | POA: Insufficient documentation

## 2020-05-10 LAB — SARS CORONAVIRUS 2 (TAT 6-24 HRS): SARS Coronavirus 2: NEGATIVE
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
## 2020-05-10 NOTE — Discharge Instructions (Signed)
Recommended going ahead and getting a Covid test.  We will need to send out to the hospital as her rapid tests are out of stock.  I did inform her that it would take 24 to 48 hours for the results to come back.  She needs to quarantine and follow CDC guidelines.  I will give her a work note keeping her up for 2 days and if her test is negative she can return to work without mass.  If she is positive then she needs to quarantine for a longer per CDC guidelines. Supportive care, over-the-counter meds as needed, Tylenol or Motrin for fever discomfort.  She has the inhaler for any respiratory or wheezing issues that she has although her exam is normal today. We will add in Tessalon Perles to be used as directed for any cough that she is having. Antibiotics are not necessary at the present time. Follow-up here as needed.

## 2020-05-10 NOTE — ED Triage Notes (Signed)
Patient complains of nasal congestion, body aches,nausea, fever x 2 days.

## 2020-05-11 DIAGNOSIS — Z5181 Encounter for therapeutic drug level monitoring: Secondary | ICD-10-CM | POA: Diagnosis not present

## 2020-05-31 DIAGNOSIS — D649 Anemia, unspecified: Secondary | ICD-10-CM | POA: Diagnosis not present

## 2020-05-31 DIAGNOSIS — N028 Recurrent and persistent hematuria with other morphologic changes: Secondary | ICD-10-CM | POA: Diagnosis not present

## 2020-05-31 DIAGNOSIS — R21 Rash and other nonspecific skin eruption: Secondary | ICD-10-CM | POA: Diagnosis not present

## 2020-06-02 DIAGNOSIS — Z5181 Encounter for therapeutic drug level monitoring: Secondary | ICD-10-CM | POA: Diagnosis not present

## 2020-06-06 DIAGNOSIS — Z419 Encounter for procedure for purposes other than remedying health state, unspecified: Secondary | ICD-10-CM | POA: Diagnosis not present

## 2020-06-19 ENCOUNTER — Encounter: Payer: Self-pay | Admitting: Family Medicine

## 2020-06-19 DIAGNOSIS — Z975 Presence of (intrauterine) contraceptive device: Secondary | ICD-10-CM | POA: Insufficient documentation

## 2020-06-20 DIAGNOSIS — Z5181 Encounter for therapeutic drug level monitoring: Secondary | ICD-10-CM | POA: Diagnosis not present

## 2020-06-27 DIAGNOSIS — Z5181 Encounter for therapeutic drug level monitoring: Secondary | ICD-10-CM | POA: Diagnosis not present

## 2020-07-04 DIAGNOSIS — Z419 Encounter for procedure for purposes other than remedying health state, unspecified: Secondary | ICD-10-CM | POA: Diagnosis not present

## 2020-07-10 ENCOUNTER — Ambulatory Visit: Payer: Self-pay

## 2020-07-11 DIAGNOSIS — N028 Recurrent and persistent hematuria with other morphologic changes: Secondary | ICD-10-CM | POA: Diagnosis not present

## 2020-08-04 DIAGNOSIS — Z419 Encounter for procedure for purposes other than remedying health state, unspecified: Secondary | ICD-10-CM | POA: Diagnosis not present

## 2020-08-25 DIAGNOSIS — L404 Guttate psoriasis: Secondary | ICD-10-CM | POA: Diagnosis not present

## 2020-08-25 DIAGNOSIS — D2330 Other benign neoplasm of skin of unspecified part of face: Secondary | ICD-10-CM | POA: Diagnosis not present

## 2020-08-28 ENCOUNTER — Emergency Department
Admission: EM | Admit: 2020-08-28 | Discharge: 2020-08-28 | Disposition: A | Discharge status: Home / Self Care | Payer: Medicaid Other | Attending: Emergency Medicine | Admitting: Emergency Medicine

## 2020-08-28 ENCOUNTER — Ambulatory Visit: Payer: Self-pay | Admitting: Internal Medicine

## 2020-08-28 ENCOUNTER — Emergency Department: Payer: Medicaid Other

## 2020-08-28 ENCOUNTER — Other Ambulatory Visit: Payer: Self-pay

## 2020-08-28 DIAGNOSIS — J341 Cyst and mucocele of nose and nasal sinus: Secondary | ICD-10-CM | POA: Diagnosis not present

## 2020-08-28 DIAGNOSIS — Z79899 Other long term (current) drug therapy: Secondary | ICD-10-CM | POA: Insufficient documentation

## 2020-08-28 DIAGNOSIS — L989 Disorder of the skin and subcutaneous tissue, unspecified: Secondary | ICD-10-CM | POA: Diagnosis not present

## 2020-08-28 DIAGNOSIS — F17200 Nicotine dependence, unspecified, uncomplicated: Secondary | ICD-10-CM | POA: Insufficient documentation

## 2020-08-28 DIAGNOSIS — L729 Follicular cyst of the skin and subcutaneous tissue, unspecified: Secondary | ICD-10-CM | POA: Diagnosis not present

## 2020-08-28 DIAGNOSIS — I10 Essential (primary) hypertension: Secondary | ICD-10-CM | POA: Diagnosis not present

## 2020-08-28 DIAGNOSIS — J3489 Other specified disorders of nose and nasal sinuses: Secondary | ICD-10-CM | POA: Diagnosis not present

## 2020-08-28 DIAGNOSIS — D2330 Other benign neoplasm of skin of unspecified part of face: Secondary | ICD-10-CM

## 2020-08-28 DIAGNOSIS — L720 Epidermal cyst: Secondary | ICD-10-CM | POA: Diagnosis not present

## 2020-08-28 DIAGNOSIS — Z87828 Personal history of other (healed) physical injury and trauma: Secondary | ICD-10-CM | POA: Diagnosis not present

## 2020-08-28 LAB — BASIC METABOLIC PANEL
Anion gap: 8 (ref 5–15)
BUN: 12 mg/dL (ref 6–20)
CO2: 23 mmol/L (ref 22–32)
Calcium: 9.2 mg/dL (ref 8.9–10.3)
Chloride: 106 mmol/L (ref 98–111)
Creatinine, Ser: 1 mg/dL (ref 0.44–1.00)
GFR, Estimated: >60 mL/min (ref >60)
Glucose, Bld: 96 mg/dL (ref 70–99)
Potassium: 3.8 mmol/L (ref 3.5–5.1)
Sodium: 137 mmol/L (ref 135–145)

## 2020-08-28 LAB — CBC
HCT: 39.4 % (ref 36.0–46.0)
Hemoglobin: 13.4 g/dL (ref 12.0–15.0)
MCH: 29.1 pg (ref 26.0–34.0)
MCHC: 34 g/dL (ref 30.0–36.0)
MCV: 85.7 fL (ref 80.0–100.0)
Platelets: 238 10*3/uL (ref 150–400)
RBC: 4.6 MIL/uL (ref 3.87–5.11)
RDW: 13.1 % (ref 11.5–15.5)
WBC: 11.9 10*3/uL — ABNORMAL HIGH (ref 4.0–10.5)
nRBC: 0 % (ref 0.0–0.2)

## 2020-08-28 MED ORDER — AMOXICILLIN-POT CLAVULANATE 875-125 MG PO TABS: 1.0000 | ORAL_TABLET | Freq: Two times a day (BID) | ORAL | 0 refills | Status: AC

## 2020-08-28 MED ORDER — AMOXICILLIN-POT CLAVULANATE 875-125 MG PO TABS
1.0000 | ORAL_TABLET | Freq: Once | ORAL | Status: AC
  Administered 2020-08-28: 1 via ORAL
  Filled 2020-08-28: qty 1

## 2020-08-28 MED ORDER — IOHEXOL 300 MG/ML  SOLN
75.0000 mL | Freq: Once | INTRAMUSCULAR | Status: AC | PRN
  Administered 2020-08-28: 75 mL via INTRAVENOUS
  Filled 2020-08-28: qty 75
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
## 2020-08-28 NOTE — Discharge Instructions (Addendum)
Please follow up with ENT. Take your antibiotics as prescribed. Return to ER if you develop any fever or other worsening.

## 2020-08-28 NOTE — ED Triage Notes (Signed)
Pt sent over from [facility] for a dermoid cyst on the bridge of her nose that is causing swelling and bruising. Patient ia alert and oriented.

## 2020-08-29 ENCOUNTER — Encounter: Payer: Self-pay | Admitting: Internal Medicine

## 2020-08-29 ENCOUNTER — Telehealth: Payer: Self-pay | Admitting: *Deleted

## 2020-08-29 ENCOUNTER — Ambulatory Visit (INDEPENDENT_AMBULATORY_CARE_PROVIDER_SITE_OTHER): Payer: Medicaid Other | Admitting: Internal Medicine

## 2020-08-29 VITALS — BP 123/83 | HR 84 | Temp 98.3°F | Ht 63.78 in | Wt 203.2 lb

## 2020-08-29 DIAGNOSIS — L409 Psoriasis, unspecified: Secondary | ICD-10-CM

## 2020-08-29 DIAGNOSIS — Z13220 Encounter for screening for lipoid disorders: Secondary | ICD-10-CM | POA: Diagnosis not present

## 2020-08-29 DIAGNOSIS — Z1329 Encounter for screening for other suspected endocrine disorder: Secondary | ICD-10-CM | POA: Diagnosis not present

## 2020-08-29 NOTE — Progress Notes (Signed)
BP 123/83   Pulse 84   Temp 98.3 F (36.8 C) (Oral)   Ht 5' 3.78" (1.62 m)   Wt 203 lb 3.2 oz (92.2 kg)   LMP 08/10/2020   SpO2 97%   BMI 35.12 kg/m    Subjective:    Patient ID: Carly Taylor, female    DOB: 02-18-1996, 25 y.o.   MRN: [id number]  HPI: Carly Taylor is a 25 y.o. female  Pt ishere to establish care , she is a 25 yr old femal with a pmh of   IGa nephropathy diagnosed in 2009 , HTN sec to this, psoriasis. 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 Pt went to the ER as her cyst is getting bigger and worse on the left side of her nasal bridge with pain when she medially abducts her eyes she has a raised WBC coiunt per labs drawn yetserday @ Er and is on augmentin, is to see ENT this Friday for possible I / D  / excisional biopsy of such  Hypertension Pertinent negatives include no chest pain, headaches, palpitations or shortness of breath.  Rash This is a chronic (multiple lesions noted on forearms/ elbows with scales ) problem. The current episode started more than 1 year ago. The problem has been gradually worsening since onset. Pertinent negatives include no congestion, cough, diarrhea, eye pain, fatigue, fever, joint pain, rhinorrhea, shortness of breath, sore throat or vomiting.    Chief Complaint  Patient presents with  . New Patient (Initial Visit)    Patient has cyst between eyes for almost a year and got worse yesterday. Patient also has skin lesions all over body.   . Cyst    CT scan at ER yesterday, was referred to ENT,  . skin lesion    Relevant past medical, surgical, family and social history reviewed and updated as indicated. Interim medical history since our last visit reviewed. Allergies and medications reviewed and updated.  Review of Systems  Constitutional: Negative for activity change, appetite change, chills, fatigue and fever.  HENT: Negative for congestion, ear discharge, ear pain, facial  swelling, rhinorrhea and sore throat.   Eyes: Negative for pain and itching.  Respiratory: Negative for cough, chest tightness, shortness of breath and wheezing.   Cardiovascular: Negative for chest pain, palpitations and leg swelling.  Gastrointestinal: Negative for abdominal distention, abdominal pain, blood in stool, constipation, diarrhea, nausea and vomiting.  Endocrine: Negative for cold intolerance, heat intolerance, polydipsia, polyphagia and polyuria.  Genitourinary: Negative for difficulty urinating, dysuria, flank pain, frequency, hematuria and urgency.  Musculoskeletal: Negative for arthralgias, gait problem, joint pain, joint swelling and myalgias.  Skin: Positive for wound. Negative for color change and rash.  Neurological: Negative for dizziness, tremors, speech difficulty, weakness, light-headedness, numbness and headaches.  Hematological: Does not bruise/bleed easily.  Psychiatric/Behavioral: Negative for agitation, confusion, decreased concentration, sleep disturbance and suicidal ideas.    Per HPI unless specifically indicated above     Objective:    BP 123/83   Pulse 84   Temp 98.3 F (36.8 C) (Oral)   Ht 5' 3.78" (1.62 m)   Wt 203 lb 3.2 oz (92.2 kg)   LMP 08/10/2020   SpO2 97%   BMI 35.12 kg/m   Wt Readings from Last 3 Encounters:  08/29/20 203 lb 3.2 oz (92.2 kg)  08/28/20 206 lb (93.4 kg)  05/10/20 200 lb (90.7 kg)    Physical  Exam Vitals and nursing note reviewed.  Constitutional:      General: She is not in acute distress.    Appearance: Normal appearance. She is not ill-appearing or diaphoretic.  HENT:     Head: Normocephalic and atraumatic.     Right Ear: Tympanic membrane and external ear normal. There is no impacted cerumen.     Left Ear: External ear normal.     Nose: No congestion or rhinorrhea.     Mouth/Throat:     Pharynx: No oropharyngeal exudate or posterior oropharyngeal erythema.  Eyes:     Conjunctiva/sclera: Conjunctivae normal.      Pupils: Pupils are equal, round, and reactive to light.  Cardiovascular:     Rate and Rhythm: Normal rate and regular rhythm.     Heart sounds: No murmur heard. No friction rub. No gallop.   Pulmonary:     Effort: No respiratory distress.     Breath sounds: No stridor. No wheezing or rhonchi.  Chest:     Chest wall: No tenderness.  Abdominal:     General: Abdomen is flat. Bowel sounds are normal. There is no distension.     Palpations: Abdomen is soft. There is no mass.     Tenderness: There is no abdominal tenderness. There is no guarding.  Musculoskeletal:        General: No swelling or deformity.     Cervical back: Normal range of motion and neck supple. No rigidity or tenderness.     Right lower leg: No edema.     Left lower leg: No edema.  Skin:    General: Skin is warm and dry.     Coloration: Skin is not jaundiced.     Findings: No erythema.  Neurological:     Mental Status: She is alert and oriented to person, place, and time. Mental status is at baseline.  Psychiatric:        Mood and Affect: Mood normal.        Behavior: Behavior normal.        Thought Content: Thought content normal.        Judgment: Judgment normal.     Results for orders placed or performed during the hospital encounter of 08/28/20  CBC  Result Value Ref Range   WBC 11.9 (H) 4.0 - 10.5 K/uL   RBC 4.60 3.87 - 5.11 MIL/uL   Hemoglobin 13.4 12.0 - 15.0 g/dL   HCT 39.4 36.0 - 46.0 %   MCV 85.7 80.0 - 100.0 fL   MCH 29.1 26.0 - 34.0 pg   MCHC 34.0 30.0 - 36.0 g/dL   RDW 13.1 11.5 - 15.5 %   Platelets 238 150 - 400 K/uL   nRBC 0.0 0.0 - 0.2 %  Basic metabolic panel  Result Value Ref Range   Sodium 137 135 - 145 mmol/L   Potassium 3.8 3.5 - 5.1 mmol/L   Chloride 106 98 - 111 mmol/L   CO2 23 22 - 32 mmol/L   Glucose, Bld 96 70 - 99 mg/dL   BUN 12 6 - 20 mg/dL   Creatinine, Ser 1.00 0.44 - 1.00 mg/dL   Calcium 9.2 8.9 - 10.3 mg/dL   GFR, Estimated >60 >60 mL/min   Anion gap 8 5 - 15         Current Outpatient Medications:  .  acetaminophen (TYLENOL) 500 MG tablet, Take 1,000 mg by mouth every 6 (six) hours as needed for headache., Disp: , Rfl:  .  albuterol (  PROVENTIL) (5 MG/ML) 0.5% nebulizer solution, Take 0.5 mLs (2.5 mg total) by nebulization every 6 (six) hours as needed for wheezing or shortness of breath., Disp: 20 mL, Rfl: 0 .  albuterol (VENTOLIN HFA) 108 (90 Base) MCG/ACT inhaler, Inhale 2 puffs into the lungs every 4 (four) hours as needed for wheezing or shortness of breath., Disp: 1 each, Rfl: 0 .  amoxicillin-clavulanate (AUGMENTIN) 875-125 MG tablet, Take 1 tablet by mouth every 12 (twelve) hours for 10 days., Disp: 20 tablet, Rfl: 0 .  lisinopril-hydrochlorothiazide (PRINZIDE,ZESTORETIC) 10-12.5 MG tablet, Take 1 tablet by mouth daily., Disp: , Rfl:     Assessment & Plan:  1. CYSt on the nasal bridge pt to fu with ENT on Friday :  2.1 x 1.8 cm ovoid cutaneous/subcutaneous lesion along the nasal bridge, as described. This could reflect an inflamed cyst (as provided in the clinical history). However, the CT imaging features are nonspecific and alternative etiologies cannot be excluded. Consider aspiration/direct tissue sampling for further evaluation. No appreciable underlying osseous erosion.  Prominent periapical lucency surrounding the left maxillary first molar. Adjacent mildly enlarged left level 1B lymph node, nonspecific but possibly reactive in the setting of a dental disease. Small bilateral maxillary sinus mucous retention cysts  2. Psoriasis : Hold off steroids since has the above infected cyst.   3. IGa nephropathy :sees Dr. Alden Hipp in [place].   4. HTN ho such is on lisinopril/ hctz sec to proteinuria : Continue current meds.  Medication compliance emphasised. pt advised to keep Bp logs. Pt verbalised understanding of the same. Pt to have a low salt diet . Exercise to reach a goal of at least 150 mins a week.  lifestyle  modifications explained and pt understands importance of the above.  Ok to stay home till Monday note provided until she can take care of her cyst  To fu with ophthalmology for possible contact lenses as her glassses hurt her nasal bridge/ cyst.  Problem List Items Addressed This Visit   None   Visit Diagnoses    Psoriasis    -  Primary   Relevant Orders   Ambulatory referral to Dermatology   Comprehensive metabolic panel   CBC with Differential/Platelet   Screening, lipid       Relevant Orders   Lipid panel   Thyroid disorder screen       Relevant Orders   Thyroid Panel With TSH       Follow up plan: No follow-ups on file.

## 2020-08-29 NOTE — Telephone Encounter (Signed)
Transition Care Management Unsuccessful Follow-up Telephone Call  Date of discharge and from where:  08/28/2020 [facility] ED  Attempts:  1st Attempt  Reason for unsuccessful TCM follow-up call:  Left voice message

## 2020-08-30 NOTE — Telephone Encounter (Signed)
Transition Care Management Unsuccessful Follow-up Telephone Call  Date of discharge and from where:  08/28/2020 - [facility] ED  Attempts:  2nd Attempt  Reason for unsuccessful TCM follow-up call:  Left voice message 

## 2020-08-31 NOTE — Telephone Encounter (Signed)
Transition Care Management Unsuccessful Follow-up Telephone Call  Date of discharge and from where:  08/28/2020 - [facility] ED  Attempts:  3rd Attempt  Reason for unsuccessful TCM follow-up call:  Left voice message 

## 2020-09-01 DIAGNOSIS — L723 Sebaceous cyst: Secondary | ICD-10-CM | POA: Diagnosis not present

## 2020-09-03 DIAGNOSIS — Z419 Encounter for procedure for purposes other than remedying health state, unspecified: Secondary | ICD-10-CM | POA: Diagnosis not present

## 2020-09-13 ENCOUNTER — Other Ambulatory Visit: Payer: Medicaid Other

## 2020-09-14 DIAGNOSIS — L723 Sebaceous cyst: Secondary | ICD-10-CM | POA: Diagnosis not present

## 2020-09-20 ENCOUNTER — Ambulatory Visit: Payer: Medicaid Other | Admitting: Internal Medicine

## 2020-09-27 ENCOUNTER — Encounter: Payer: Self-pay | Admitting: Otolaryngology

## 2020-09-29 NOTE — Discharge Instructions (Signed)

## 2020-10-04 DIAGNOSIS — Z419 Encounter for procedure for purposes other than remedying health state, unspecified: Secondary | ICD-10-CM | POA: Diagnosis not present

## 2020-10-05 ENCOUNTER — Ambulatory Visit
Admission: RE | Admit: 2020-10-05 | Discharge: 2020-10-05 | Disposition: A | Discharge status: Home / Self Care | Payer: Medicaid Other | Attending: Otolaryngology | Admitting: Otolaryngology

## 2020-10-05 ENCOUNTER — Encounter
Admission: RE | Disposition: A | Discharge status: Home / Self Care | Payer: Self-pay | Source: Home / Self Care | Attending: Otolaryngology

## 2020-10-05 ENCOUNTER — Ambulatory Visit: Payer: Medicaid Other | Admitting: Anesthesiology

## 2020-10-05 ENCOUNTER — Other Ambulatory Visit: Payer: Self-pay

## 2020-10-05 ENCOUNTER — Encounter: Payer: Self-pay | Admitting: Otolaryngology

## 2020-10-05 DIAGNOSIS — I10 Essential (primary) hypertension: Secondary | ICD-10-CM | POA: Insufficient documentation

## 2020-10-05 DIAGNOSIS — L723 Sebaceous cyst: Secondary | ICD-10-CM | POA: Diagnosis not present

## 2020-10-05 DIAGNOSIS — L923 Foreign body granuloma of the skin and subcutaneous tissue: Secondary | ICD-10-CM | POA: Diagnosis not present

## 2020-10-05 DIAGNOSIS — Z79899 Other long term (current) drug therapy: Secondary | ICD-10-CM | POA: Diagnosis not present

## 2020-10-05 DIAGNOSIS — F17200 Nicotine dependence, unspecified, uncomplicated: Secondary | ICD-10-CM | POA: Diagnosis not present

## 2020-10-05 DIAGNOSIS — L720 Epidermal cyst: Secondary | ICD-10-CM | POA: Diagnosis not present

## 2020-10-05 HISTORY — PX: MASS EXCISION: SHX2000

## 2020-10-05 LAB — POCT PREGNANCY, URINE: Preg Test, Ur: NEGATIVE

## 2020-10-05 SURGERY — EXCISION MASS
Anesthesia: General | Site: Nose

## 2020-10-05 MED ORDER — FENTANYL CITRATE (PF) 100 MCG/2ML IJ SOLN: 25.0000 ug | INTRAMUSCULAR | Status: DC | PRN

## 2020-10-05 MED ORDER — HYDROCODONE-ACETAMINOPHEN 5-325 MG PO TABS: 1.0000 | ORAL_TABLET | Freq: Four times a day (QID) | ORAL | 0 refills | Status: AC | PRN

## 2020-10-05 MED ORDER — ACETAMINOPHEN 160 MG/5ML PO SOLN: 325.0000 mg | ORAL | Status: DC | PRN

## 2020-10-05 MED ORDER — PROPOFOL 500 MG/50ML IV EMUL
INTRAVENOUS | Status: DC | PRN
  Administered 2020-10-05: 75 ug/kg/min via INTRAVENOUS

## 2020-10-05 MED ORDER — LACTATED RINGERS IV SOLN: INTRAVENOUS | Status: DC

## 2020-10-05 MED ORDER — ACETAMINOPHEN 325 MG PO TABS: 325.0000 mg | ORAL_TABLET | ORAL | Status: DC | PRN

## 2020-10-05 MED ORDER — BACITRACIN 500 UNIT/GM EX OINT
TOPICAL_OINTMENT | CUTANEOUS | Status: DC | PRN
  Administered 2020-10-05: 1 via TOPICAL

## 2020-10-05 MED ORDER — ONDANSETRON HCL 4 MG/2ML IJ SOLN: 4.0000 mg | Freq: Once | INTRAMUSCULAR | Status: DC | PRN

## 2020-10-05 MED ORDER — MIDAZOLAM HCL 2 MG/2ML IJ SOLN
INTRAMUSCULAR | Status: DC | PRN
  Administered 2020-10-05: 2 mg via INTRAVENOUS

## 2020-10-05 MED ORDER — LIDOCAINE HCL (CARDIAC) PF 100 MG/5ML IV SOSY
PREFILLED_SYRINGE | INTRAVENOUS | Status: DC | PRN
  Administered 2020-10-05: 50 mg via INTRAVENOUS

## 2020-10-05 MED ORDER — PROPOFOL 10 MG/ML IV BOLUS
INTRAVENOUS | Status: DC | PRN
  Administered 2020-10-05: 100 mg via INTRAVENOUS

## 2020-10-05 MED ORDER — FENTANYL CITRATE (PF) 100 MCG/2ML IJ SOLN
INTRAMUSCULAR | Status: DC | PRN
  Administered 2020-10-05: 100 ug via INTRAVENOUS

## 2020-10-05 MED ORDER — ONDANSETRON HCL 4 MG/2ML IJ SOLN
INTRAMUSCULAR | Status: DC | PRN
  Administered 2020-10-05: 4 mg via INTRAVENOUS

## 2020-10-05 MED ORDER — LIDOCAINE-EPINEPHRINE 1 %-1:100000 IJ SOLN
INTRAMUSCULAR | Status: DC | PRN
  Administered 2020-10-05: 2.5 mL

## 2020-10-05 MED ORDER — OXYCODONE HCL 5 MG PO TABS
5.0000 mg | ORAL_TABLET | Freq: Once | ORAL | Status: AC | PRN
Start: 2020-10-05 — End: 2020-10-05
  Administered 2020-10-05: 5 mg via ORAL

## 2020-10-05 MED ORDER — DEXMEDETOMIDINE HCL 200 MCG/2ML IV SOLN
INTRAVENOUS | Status: DC | PRN
  Administered 2020-10-05: 10 ug via INTRAVENOUS

## 2020-10-05 MED ORDER — OXYCODONE HCL 5 MG/5ML PO SOLN: 5.0000 mg | Freq: Once | ORAL | Status: AC | PRN

## 2020-10-05 MED ORDER — DEXAMETHASONE SODIUM PHOSPHATE 10 MG/ML IJ SOLN
INTRAMUSCULAR | Status: DC | PRN
  Administered 2020-10-05: 4 mg via INTRAVENOUS

## 2020-10-05 SURGICAL SUPPLY — 19 items
CORD BIP STRL DISP 12FT (MISCELLANEOUS) ×2 IMPLANT
DRAPE HEAD BAR (DRAPES) ×2 IMPLANT
ELECT REM PT RETURN 9FT ADLT (ELECTROSURGICAL) ×2
ELECTRODE REM PT RTRN 9FT ADLT (ELECTROSURGICAL) ×1 IMPLANT
GOWN STRL REUS W/ TWL LRG LVL3 (GOWN DISPOSABLE) ×1 IMPLANT
GOWN STRL REUS W/TWL LRG LVL3 (GOWN DISPOSABLE) ×2
KIT TURNOVER KIT A (KITS) ×2 IMPLANT
NEEDLE HYPO 25GX1X1/2 BEV (NEEDLE) ×2 IMPLANT
NS IRRIG 500ML POUR BTL (IV SOLUTION) ×2 IMPLANT
PACK ENT CUSTOM (PACKS) ×2 IMPLANT
SOL PREP PVP 2OZ (MISCELLANEOUS) ×2
SOLUTION PREP PVP 2OZ (MISCELLANEOUS) ×1 IMPLANT
STRAP BODY AND KNEE 60X3 (MISCELLANEOUS) ×2 IMPLANT
SUCTION FRAZIER HANDLE 10FR (MISCELLANEOUS) ×1
SUCTION TUBE FRAZIER 10FR DISP (MISCELLANEOUS) ×1 IMPLANT
SUT PROLENE 6 0 P 1 18 (SUTURE) ×2 IMPLANT
SUT VIC AB 5-0 PC1 18 (SUTURE) ×2 IMPLANT
SUT VICRYL 5-0 (SUTURE) ×2 IMPLANT
SYR 10ML LL (SYRINGE) ×2 IMPLANT

## 2020-10-05 NOTE — Anesthesia Procedure Notes (Signed)
Performed by: Tina Gruner, CRNA Pre-anesthesia Checklist: Patient identified, Emergency Drugs available, Suction available, Timeout performed and Patient being monitored Patient Re-evaluated:Patient Re-evaluated prior to induction Oxygen Delivery Method: Nasal cannula Placement Confirmation: positive ETCO2       

## 2020-10-05 NOTE — H&P (Signed)
H&P has been reviewed and patient reevaluated, no changes necessary. To be downloaded later.  

## 2020-10-05 NOTE — Transfer of Care (Signed)
Immediate Anesthesia Transfer of Care Note  Patient: Carly Taylor  Procedure(s) Performed: EXCISION OF BENIGN SKIN LESION, A/R FLAP- NOSE WITH IV SEDATION AND LOCAL ANESTHESIA (N/A Nose)  Patient Location: PACU  Anesthesia Type: General  Level of Consciousness: awake, alert  and patient cooperative  Airway and Oxygen Therapy: Patient Spontanous Breathing and Patient connected to supplemental oxygen  Post-op Assessment: Post-op Vital signs reviewed, Patient's Cardiovascular Status Stable, Respiratory Function Stable, Patent Airway and No signs of Nausea or vomiting  Post-op Vital Signs: Reviewed and stable  Complications: No complications documented.

## 2020-10-05 NOTE — Anesthesia Procedure Notes (Signed)
Procedure Name: LMA Insertion Date/Time: 10/05/2020 12:33 PM Performed by: Mayme Genta, CRNA Pre-anesthesia Checklist: Patient identified, Emergency Drugs available, Suction available, Timeout performed and Patient being monitored Patient Re-evaluated:Patient Re-evaluated prior to induction Oxygen Delivery Method: Circle system utilized Preoxygenation: Pre-oxygenation with 100% oxygen Induction Type: IV induction LMA: LMA inserted LMA Size: 4.0 Number of attempts: 1 Placement Confirmation: positive ETCO2 and breath sounds checked- equal and bilateral Tube secured with: Tape

## 2020-10-05 NOTE — Op Note (Signed)
10/05/2020  1:25 PM    Carly Taylor, Carly Taylor  [id number]   Pre-Op Dx: Large glabellar cyst  Post-op Dx: Same  Proc: Excision glabellar cyst, 2 cm in diameter.  Advancement flap reconstruction and closure of the wound.  Surg:  Elon Alas Jillian Warth  Anes:  Gen,  LMA  EBL: 30 mL  Comp: None  Findings: There is still was cheesy material inside the cyst but it now was smaller.  There is a lot of scar tissue and inflammation around it.  The skin was advanced superiorly and inferiorly to bring it together in close that and a layered closure.  Procedure: Patient was brought to the operating room and placed in supine position.  She is given some sedation and local anesthesia was placed into the glabellar area around the cyst.  2.5 mL of 1% Xylocaine with epi 1: 100,000 was used for infiltration.  Patient was moving around the lifting her arms up and touching her face so she was then given general anesthesia by laryngeal mask.  Once patient was fully asleep she was prepped and draped in sterile fashion.  An elliptical incision was created in the glabella surrounding the skin opening to the cyst.  There was some drainage coming from this hole currently.  Once the incision was created the skin was elevated superiorly and inferiorly to the cyst and skin.  Then the soft tissue underneath the cyst was freed up from the cyst to remove the entire mass.  The cyst was lying slightly to the left side of center.  It was scarred down to the muscle over the glabella.  The cyst was sent for permanent section.  There is bleeding more from the left side where the vein on the left side of the nose had been entered.  Bipolar cautery was used for controlling bleeding here.  Wound was now further evaluated and there was no further evidence of cyst evident.  There is no significant bleeding.  There was a muscle layer overlying the nasal bone.  The skin edges superiorly and inferiorly were elevated further to allow advancement  flap closure.  5-0 Vicryl's were then used for closure of the dermis to take the tension off of the skin edge.  Multiple interrupted 5-0 Vicryl's were placed.  The skin edges were then closed using 6-0 Prolene interrupted sutures across the wound.  The wound was covered with some bacitracin followed by Band-Aids crisscrossing the nasal glabella.  The patient tolerated the procedure well.  She was awakened and taken to the recovery room in satisfactory condition.  There were no operative complications.   Dispo:   To PACU to be discharged home  Plan: To follow-up in the office in 1 week for suture removal.  She will keep a Band-Aid on the wound at all times but can change it periodically if it gets dirty or saturated.  I have given her 20 pills Norco 5/325 to use for severe pain if needed.  She will not soak the wound but keep it dry.  Elon Alas [name]  10/05/2020 1:25 PM

## 2020-10-05 NOTE — Anesthesia Preprocedure Evaluation (Signed)
Anesthesia Evaluation  Patient identified by MRN, date of birth, ID band Patient awake    Reviewed: Allergy & Precautions, H&P , NPO status , Patient's Chart, lab work & pertinent test results  Airway Mallampati: II  TM Distance: >3 FB Neck ROM: full    Dental no notable dental hx.    Pulmonary Current SmokerPatient did not abstain from smoking.,    Pulmonary exam normal breath sounds clear to auscultation       Cardiovascular hypertension, Normal cardiovascular exam Rhythm:regular Rate:Normal     Neuro/Psych    GI/Hepatic   Endo/Other    Renal/GU Renal disease     Musculoskeletal   Abdominal   Peds  Hematology   Anesthesia Other Findings   Reproductive/Obstetrics                             Anesthesia Physical Anesthesia Plan  ASA: III  Anesthesia Plan: General   Post-op Pain Management:    Induction: Intravenous  PONV Risk Score and Plan: 3 and Treatment may vary due to age or medical condition, Propofol infusion, TIVA and Ondansetron  Airway Management Planned: Natural Airway  Additional Equipment:   Intra-op Plan:   Post-operative Plan:   Informed Consent: I have reviewed the patients History and Physical, chart, labs and discussed the procedure including the risks, benefits and alternatives for the proposed anesthesia with the patient or authorized representative who has indicated his/her understanding and acceptance.     Dental Advisory Given  Plan Discussed with: CRNA  Anesthesia Plan Comments:         Anesthesia Quick Evaluation

## 2020-10-05 NOTE — Anesthesia Postprocedure Evaluation (Signed)
Anesthesia Post Note  Patient: Carly Taylor  Procedure(s) Performed: EXCISION OF BENIGN SKIN LESION, A/R FLAP- NOSE WITH IV SEDATION AND LOCAL ANESTHESIA (N/A Nose)     Patient location during evaluation: PACU Anesthesia Type: General Level of consciousness: awake and alert and oriented Pain management: satisfactory to patient Vital Signs Assessment: post-procedure vital signs reviewed and stable Respiratory status: spontaneous breathing, nonlabored ventilation and respiratory function stable Cardiovascular status: blood pressure returned to baseline and stable Postop Assessment: Adequate PO intake and No signs of nausea or vomiting Anesthetic complications: no   No complications documented.  Raliegh Ip

## 2020-10-09 LAB — SURGICAL PATHOLOGY

## 2020-11-03 DIAGNOSIS — Z419 Encounter for procedure for purposes other than remedying health state, unspecified: Secondary | ICD-10-CM | POA: Diagnosis not present

## 2020-11-08 ENCOUNTER — Telehealth: Payer: Self-pay

## 2020-11-08 NOTE — Telephone Encounter (Signed)
Copied from [place] [id number]. Topic: Referral - Request for Referral >> Aug 31, 2020  9:37 AM Loma Boston wrote: Has patient seen PCP for this complaint? yes *If NO, is insurance requiring patient see PCP for this issue before PCP can refer them? Referral for which specialty: Dermatology Preferred provider/office:Pt was referred 4/26 but  the office does not take [employer] Reason for referral:pt does not know any suggestions/ [facility] Dermatology does not take Call bk pt [phone] >> Nov 08, 2020  4:09 PM Celene Kras wrote: PT calling again regarding this. She states that she never received a call with the new dermatologist. Please advise.

## 2020-11-13 NOTE — Telephone Encounter (Signed)
Patient was made aware of the referral to [facility]. Verbalized understanding.

## 2020-12-01 ENCOUNTER — Other Ambulatory Visit: Payer: Self-pay

## 2020-12-01 ENCOUNTER — Emergency Department
Admission: EM | Admit: 2020-12-01 | Discharge: 2020-12-01 | Disposition: A | Discharge status: Home / Self Care | Payer: Medicaid Other | Attending: Emergency Medicine | Admitting: Emergency Medicine

## 2020-12-01 ENCOUNTER — Ambulatory Visit (INDEPENDENT_AMBULATORY_CARE_PROVIDER_SITE_OTHER): Payer: Medicaid Other

## 2020-12-01 ENCOUNTER — Emergency Department: Payer: Medicaid Other

## 2020-12-01 ENCOUNTER — Encounter: Payer: Self-pay | Admitting: Emergency Medicine

## 2020-12-01 ENCOUNTER — Ambulatory Visit
Admission: EM | Admit: 2020-12-01 | Discharge: 2020-12-01 | Disposition: A | Discharge status: Home / Self Care | Payer: Medicaid Other | Attending: Family Medicine | Admitting: Family Medicine

## 2020-12-01 DIAGNOSIS — I10 Essential (primary) hypertension: Secondary | ICD-10-CM | POA: Insufficient documentation

## 2020-12-01 DIAGNOSIS — K8500 Idiopathic acute pancreatitis without necrosis or infection: Secondary | ICD-10-CM | POA: Diagnosis not present

## 2020-12-01 DIAGNOSIS — R809 Proteinuria, unspecified: Secondary | ICD-10-CM

## 2020-12-01 DIAGNOSIS — R1084 Generalized abdominal pain: Secondary | ICD-10-CM

## 2020-12-01 DIAGNOSIS — F17210 Nicotine dependence, cigarettes, uncomplicated: Secondary | ICD-10-CM | POA: Insufficient documentation

## 2020-12-01 DIAGNOSIS — R109 Unspecified abdominal pain: Secondary | ICD-10-CM | POA: Diagnosis not present

## 2020-12-01 DIAGNOSIS — Z79899 Other long term (current) drug therapy: Secondary | ICD-10-CM | POA: Diagnosis not present

## 2020-12-01 DIAGNOSIS — R590 Localized enlarged lymph nodes: Secondary | ICD-10-CM | POA: Diagnosis not present

## 2020-12-01 DIAGNOSIS — R319 Hematuria, unspecified: Secondary | ICD-10-CM | POA: Diagnosis not present

## 2020-12-01 DIAGNOSIS — Q7649 Other congenital malformations of spine, not associated with scoliosis: Secondary | ICD-10-CM | POA: Diagnosis not present

## 2020-12-01 DIAGNOSIS — Z9089 Acquired absence of other organs: Secondary | ICD-10-CM | POA: Insufficient documentation

## 2020-12-01 DIAGNOSIS — N201 Calculus of ureter: Secondary | ICD-10-CM | POA: Diagnosis not present

## 2020-12-01 DIAGNOSIS — R1030 Lower abdominal pain, unspecified: Secondary | ICD-10-CM | POA: Diagnosis not present

## 2020-12-01 DIAGNOSIS — R300 Dysuria: Secondary | ICD-10-CM | POA: Diagnosis not present

## 2020-12-01 LAB — URINALYSIS, COMPLETE (UACMP) WITH MICROSCOPIC
Bilirubin Urine: NEGATIVE
Glucose, UA: NEGATIVE mg/dL
Ketones, ur: NEGATIVE mg/dL
Leukocytes,Ua: NEGATIVE
Nitrite: NEGATIVE
Protein, ur: >300 mg/dL — AB
Specific Gravity, Urine: 1.02 (ref 1.005–1.030)
pH: 5.5 (ref 5.0–8.0)

## 2020-12-01 LAB — COMPREHENSIVE METABOLIC PANEL
ALT: 13 U/L (ref 0–44)
AST: 15 U/L (ref 15–41)
Albumin: 3.5 g/dL (ref 3.5–5.0)
Alkaline Phosphatase: 79 U/L (ref 38–126)
Anion gap: 7 (ref 5–15)
BUN: 12 mg/dL (ref 6–20)
CO2: 23 mmol/L (ref 22–32)
Calcium: 8.8 mg/dL — ABNORMAL LOW (ref 8.9–10.3)
Chloride: 108 mmol/L (ref 98–111)
Creatinine, Ser: 0.91 mg/dL (ref 0.44–1.00)
GFR, Estimated: >60 mL/min (ref >60)
Glucose, Bld: 95 mg/dL (ref 70–99)
Potassium: 3.7 mmol/L (ref 3.5–5.1)
Sodium: 138 mmol/L (ref 135–145)
Total Bilirubin: 0.8 mg/dL (ref 0.3–1.2)
Total Protein: 6.4 g/dL — ABNORMAL LOW (ref 6.5–8.1)

## 2020-12-01 LAB — CBC WITH DIFFERENTIAL/PLATELET
Abs Immature Granulocytes: 0.04 10*3/uL (ref 0.00–0.07)
Basophils Absolute: 0 10*3/uL (ref 0.0–0.1)
Basophils Relative: 1 %
Eosinophils Absolute: 0.1 10*3/uL (ref 0.0–0.5)
Eosinophils Relative: 1 %
HCT: 38.9 % (ref 36.0–46.0)
Hemoglobin: 13.7 g/dL (ref 12.0–15.0)
Immature Granulocytes: 1 %
Lymphocytes Relative: 24 %
Lymphs Abs: 2.1 10*3/uL (ref 0.7–4.0)
MCH: 29.2 pg (ref 26.0–34.0)
MCHC: 35.2 g/dL (ref 30.0–36.0)
MCV: 82.9 fL (ref 80.0–100.0)
Monocytes Absolute: 0.3 10*3/uL (ref 0.1–1.0)
Monocytes Relative: 3 %
Neutro Abs: 6.2 10*3/uL (ref 1.7–7.7)
Neutrophils Relative %: 70 %
Platelets: 233 10*3/uL (ref 150–400)
RBC: 4.69 MIL/uL (ref 3.87–5.11)
RDW: 13.2 % (ref 11.5–15.5)
WBC: 8.7 10*3/uL (ref 4.0–10.5)
nRBC: 0 % (ref 0.0–0.2)

## 2020-12-01 LAB — PREGNANCY, URINE: Preg Test, Ur: NEGATIVE

## 2020-12-01 LAB — LIPASE, BLOOD: Lipase: 184 U/L — ABNORMAL HIGH (ref 11–51)

## 2020-12-01 MED ORDER — OXYCODONE HCL 5 MG PO TABS
5.0000 mg | ORAL_TABLET | Freq: Once | ORAL | Status: AC
  Administered 2020-12-01: 5 mg via ORAL
  Filled 2020-12-01: qty 1

## 2020-12-01 MED ORDER — LACTATED RINGERS IV BOLUS
1000.0000 mL | Freq: Once | INTRAVENOUS | Status: AC
  Administered 2020-12-01: 1000 mL via INTRAVENOUS

## 2020-12-01 MED ORDER — OXYCODONE HCL 5 MG PO TABS: 5.0000 mg | ORAL_TABLET | Freq: Three times a day (TID) | ORAL | 0 refills | Status: AC | PRN

## 2020-12-01 MED ORDER — ONDANSETRON 4 MG PO TBDP: 4.0000 mg | ORAL_TABLET | Freq: Three times a day (TID) | ORAL | 0 refills | Status: DC | PRN

## 2020-12-01 MED ORDER — ACETAMINOPHEN 500 MG PO TABS
1000.0000 mg | ORAL_TABLET | Freq: Once | ORAL | Status: AC
  Administered 2020-12-01: 1000 mg via ORAL
  Filled 2020-12-01: qty 2

## 2020-12-01 MED ORDER — MORPHINE SULFATE (PF) 4 MG/ML IV SOLN
4.0000 mg | Freq: Once | INTRAVENOUS | Status: AC
  Administered 2020-12-01: 4 mg via INTRAVENOUS
  Filled 2020-12-01: qty 1
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
## 2020-12-01 NOTE — ED Triage Notes (Signed)
Pt to ER with c/o global abdominal pain that started this AM.  Pt was seen at [facility] and told that her pancrease and gallbladder levels were elevated and that she should come here for further evaluation.

## 2020-12-01 NOTE — ED Notes (Signed)
Patient is being discharged from the Urgent Care and sent to the Emergency Department via private vehicle . Per Margarette Canada, NP, patient is in need of higher level of care due to needing further imaging. Patient is aware and verbalizes understanding of plan of care.  Vitals:   12/01/20 0927  BP: (!) 166/111  Pulse: 80  Resp: 18  Temp: 98.2 F (36.8 C)  SpO2: 99%
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
## 2020-12-01 NOTE — ED Triage Notes (Signed)
Pt presents today with c/o of right lower abdominal pain (+appendectomy 2009) that began this morning. +dysuria, +hesitancy. Denies n/v/d.

## 2020-12-01 NOTE — Discharge Instructions (Addendum)
Use Tylenol for pain and fevers.  Up to 1000 mg per dose, up to 4 times per day.  Do not take more than 4000 mg of Tylenol/acetaminophen within 24 hours..  Use naproxen/Aleve for anti-inflammatory pain relief. Use up to '500mg'$  every 12 hours. Do not take more frequently than this. Do not use other NSAIDs (ibuprofen, Advil) while taking this medication. It is safe to take Tylenol with this.   Use the zofran as needed for any nausea.  Oxycodone as needed for any severe pain not controlled with the above medications.

## 2020-12-01 NOTE — ED Notes (Signed)
E-signature pad unavailable - Pt verbalized understanding of D/C information - no additional concerns at this time.  

## 2020-12-01 NOTE — Discharge Instructions (Addendum)
Your blood work today demonstrated an elevated lipase at 3 times the normal level which could be an indication of possible developing pancreatitis or stemming from possible gallstone.  Your urinalysis did show significant protein in your urine which was not present at your last visit to nephrology in 2018.  This may represent an interval worsening of your IgA nephropathy.  I am recommending that she go to the emergency department to be evaluated for possible gallbladder or early developing pancreatitis as well as further testing of your kidneys.

## 2020-12-01 NOTE — ED Notes (Signed)
Blood work and urine done at [facility] prior to pt coming to ER.  Blood work deferred at this time.  Dr. Cinda Quest to review chart.

## 2020-12-04 ENCOUNTER — Telehealth: Payer: Self-pay

## 2020-12-04 DIAGNOSIS — Z419 Encounter for procedure for purposes other than remedying health state, unspecified: Secondary | ICD-10-CM | POA: Diagnosis not present

## 2020-12-04 NOTE — Telephone Encounter (Signed)
Transition Care Management Follow-up Telephone Call Date of discharge and from where: 12/01/2020-[facility] How have you been since you were released from the hospital? Patient stated she is doing ok.  Any questions or concerns? No  Items Reviewed: Did the pt receive and understand the discharge instructions provided? Yes  Medications obtained and verified? Yes  Other? No  Any new allergies since your discharge? No  Dietary orders reviewed? N/A Do you have support at home? Yes   Home Care and Equipment/Supplies: Were home health services ordered? not applicable If so, what is the name of the agency? N/A  Has the agency set up a time to come to the patient's home? not applicable Were any new equipment or medical supplies ordered?  No What is the name of the medical supply agency? N/A Were you able to get the supplies/equipment? not applicable Do you have any questions related to the use of the equipment or supplies? No  Functional Questionnaire: (I = Independent and D = Dependent) ADLs: I  Bathing/Dressing- I  Meal Prep- I  Eating- I  Maintaining continence- I  Transferring/Ambulation- I  Managing Meds- I  Follow up appointments reviewed:  PCP Hospital f/u appt confirmed? No  . [facility] f/u appt confirmed? No   Are transportation arrangements needed? No  If their condition worsens, is the pt aware to call PCP or go to the Emergency Dept.? Yes Was the patient provided with contact information for the PCP's office or ED? Yes Was to pt encouraged to call back with questions or concerns? Yes

## 2020-12-14 DIAGNOSIS — I10 Essential (primary) hypertension: Secondary | ICD-10-CM | POA: Diagnosis not present

## 2020-12-14 DIAGNOSIS — N028 Recurrent and persistent hematuria with other morphologic changes: Secondary | ICD-10-CM | POA: Diagnosis not present

## 2020-12-14 DIAGNOSIS — L409 Psoriasis, unspecified: Secondary | ICD-10-CM | POA: Diagnosis not present

## 2020-12-14 DIAGNOSIS — K859 Acute pancreatitis without necrosis or infection, unspecified: Secondary | ICD-10-CM | POA: Diagnosis not present

## 2021-01-04 DIAGNOSIS — Z419 Encounter for procedure for purposes other than remedying health state, unspecified: Secondary | ICD-10-CM | POA: Diagnosis not present

## 2021-02-03 DIAGNOSIS — Z419 Encounter for procedure for purposes other than remedying health state, unspecified: Secondary | ICD-10-CM | POA: Diagnosis not present

## 2021-03-06 DIAGNOSIS — Z419 Encounter for procedure for purposes other than remedying health state, unspecified: Secondary | ICD-10-CM | POA: Diagnosis not present

## 2021-04-05 DIAGNOSIS — Z419 Encounter for procedure for purposes other than remedying health state, unspecified: Secondary | ICD-10-CM | POA: Diagnosis not present

## 2021-05-06 DIAGNOSIS — Z419 Encounter for procedure for purposes other than remedying health state, unspecified: Secondary | ICD-10-CM | POA: Diagnosis not present

## 2021-06-06 DIAGNOSIS — Z419 Encounter for procedure for purposes other than remedying health state, unspecified: Secondary | ICD-10-CM | POA: Diagnosis not present

## 2021-06-11 ENCOUNTER — Encounter: Payer: Self-pay | Admitting: Advanced Practice Midwife

## 2021-06-11 ENCOUNTER — Ambulatory Visit (LOCAL_COMMUNITY_HEALTH_CENTER): Payer: Medicaid Other | Admitting: Advanced Practice Midwife

## 2021-06-11 ENCOUNTER — Other Ambulatory Visit: Payer: Self-pay

## 2021-06-11 VITALS — BP 141/100 | Ht 64.0 in | Wt 188.0 lb

## 2021-06-11 DIAGNOSIS — F17200 Nicotine dependence, unspecified, uncomplicated: Secondary | ICD-10-CM | POA: Diagnosis not present

## 2021-06-11 DIAGNOSIS — Z30433 Encounter for removal and reinsertion of intrauterine contraceptive device: Secondary | ICD-10-CM

## 2021-06-11 DIAGNOSIS — Z3009 Encounter for other general counseling and advice on contraception: Secondary | ICD-10-CM

## 2021-06-11 DIAGNOSIS — I159 Secondary hypertension, unspecified: Secondary | ICD-10-CM

## 2021-06-11 DIAGNOSIS — Z3049 Encounter for surveillance of other contraceptives: Secondary | ICD-10-CM

## 2021-06-11 DIAGNOSIS — T7411XS Adult physical abuse, confirmed, sequela: Secondary | ICD-10-CM

## 2021-06-11 DIAGNOSIS — T7411XA Adult physical abuse, confirmed, initial encounter: Secondary | ICD-10-CM

## 2021-06-11 DIAGNOSIS — N289 Disorder of kidney and ureter, unspecified: Secondary | ICD-10-CM

## 2021-06-11 DIAGNOSIS — L409 Psoriasis, unspecified: Secondary | ICD-10-CM

## 2021-06-11 DIAGNOSIS — L732 Hidradenitis suppurativa: Secondary | ICD-10-CM

## 2021-06-11 DIAGNOSIS — E669 Obesity, unspecified: Secondary | ICD-10-CM

## 2021-06-11 DIAGNOSIS — I10 Essential (primary) hypertension: Secondary | ICD-10-CM | POA: Insufficient documentation

## 2021-06-11 HISTORY — DX: Adult physical abuse, confirmed, initial encounter: T74.11XA

## 2021-06-11 LAB — WET PREP FOR TRICH, YEAST, CLUE
Trichomonas Exam: NEGATIVE
Yeast Exam: NEGATIVE

## 2021-06-11 NOTE — Progress Notes (Signed)
[employer] results reviewed. Per standing orders no treatment indicated. Hal Morales, RN
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
## 2021-06-11 NOTE — Progress Notes (Signed)
See encounter dated 06/11/21

## 2021-06-11 NOTE — Progress Notes (Signed)
Here today for PE and IUD removal/reinsertion. Last PE here with Paragard IUD insertion was 01/10/2017. LMP was 05/21/2021. BP repeated and charted in VS tab. Patient wants STD screening today including bloodwork. Hal Morales, RN

## 2021-06-12 LAB — HEMOGLOBIN: Hemoglobin: 14.1 g/dL (ref 11.1–15.9)

## 2021-06-14 LAB — IGP, RFX APTIMA HPV ASCU: PAP Smear Comment: 0

## 2021-07-04 DIAGNOSIS — Z419 Encounter for procedure for purposes other than remedying health state, unspecified: Secondary | ICD-10-CM | POA: Diagnosis not present

## 2021-08-04 DIAGNOSIS — Z419 Encounter for procedure for purposes other than remedying health state, unspecified: Secondary | ICD-10-CM | POA: Diagnosis not present

## 2021-09-03 DIAGNOSIS — Z419 Encounter for procedure for purposes other than remedying health state, unspecified: Secondary | ICD-10-CM | POA: Diagnosis not present

## 2021-10-04 DIAGNOSIS — Z419 Encounter for procedure for purposes other than remedying health state, unspecified: Secondary | ICD-10-CM | POA: Diagnosis not present

## 2021-10-22 DIAGNOSIS — H6123 Impacted cerumen, bilateral: Secondary | ICD-10-CM | POA: Diagnosis not present

## 2021-10-22 DIAGNOSIS — H6691 Otitis media, unspecified, right ear: Secondary | ICD-10-CM | POA: Diagnosis not present

## 2021-10-22 DIAGNOSIS — R030 Elevated blood-pressure reading, without diagnosis of hypertension: Secondary | ICD-10-CM | POA: Diagnosis not present

## 2021-10-22 DIAGNOSIS — H6092 Unspecified otitis externa, left ear: Secondary | ICD-10-CM | POA: Diagnosis not present

## 2021-11-03 DIAGNOSIS — Z419 Encounter for procedure for purposes other than remedying health state, unspecified: Secondary | ICD-10-CM | POA: Diagnosis not present

## 2021-12-04 DIAGNOSIS — Z419 Encounter for procedure for purposes other than remedying health state, unspecified: Secondary | ICD-10-CM | POA: Diagnosis not present

## 2021-12-16 IMAGING — CT CT RENAL STONE PROTOCOL
2 of 4 series · 16 of 46 positions shown, 18 images · non-contrast
Comparison: 08/26/2013

CLINICAL DATA: Hematuria, dysuria, right lower abdominal pain.
History of appendectomy.

EXAM:
CT ABDOMEN AND PELVIS WITHOUT CONTRAST
TECHNIQUE: Multidetector CT imaging of the abdomen and pelvis was performed
following the standard protocol without IV contrast.

[Series 2: stone full standard · axial · 0.81mm/px · z∈[-1136,-696]mm · 13 of 98 slices shown, 15 images]
[im 5/98  soft-tissue]
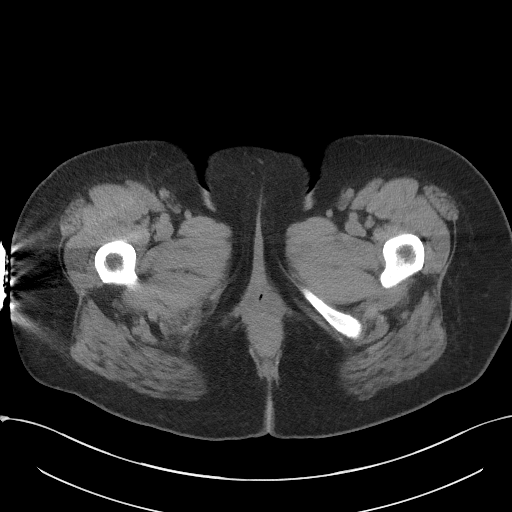
[im 5/98  bone]
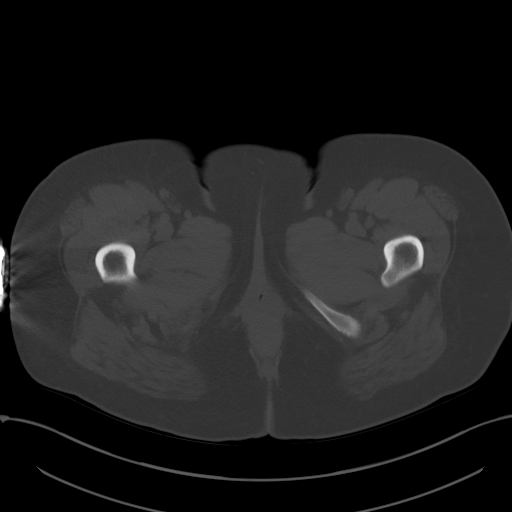
[im 13/98  soft-tissue]
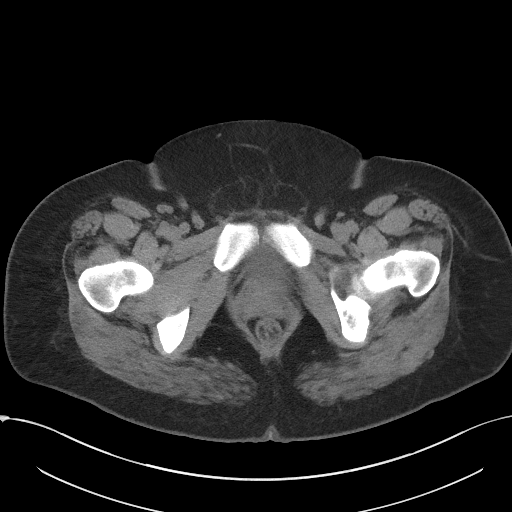
[im 22/98  soft-tissue]
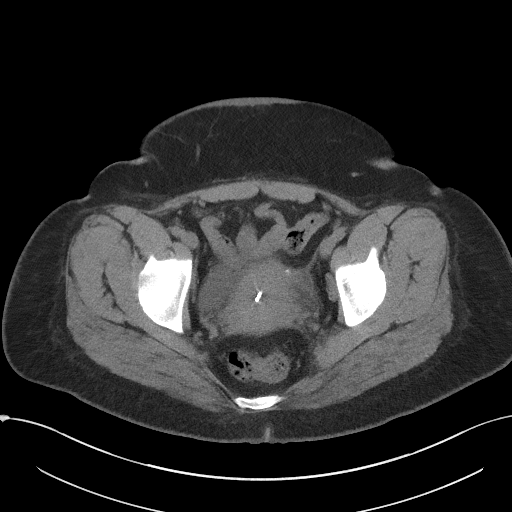
[im 26/98  soft-tissue]
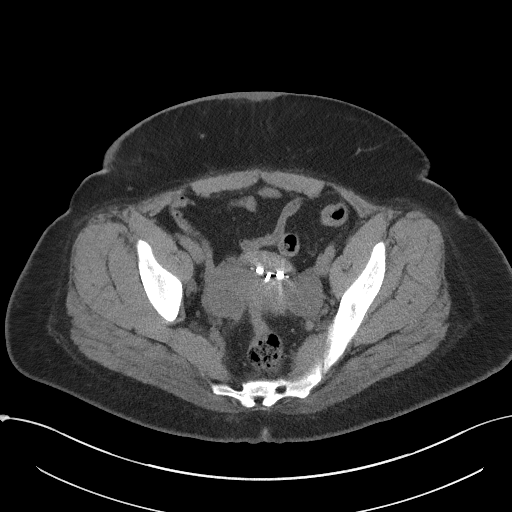
[im 34/98  soft-tissue]
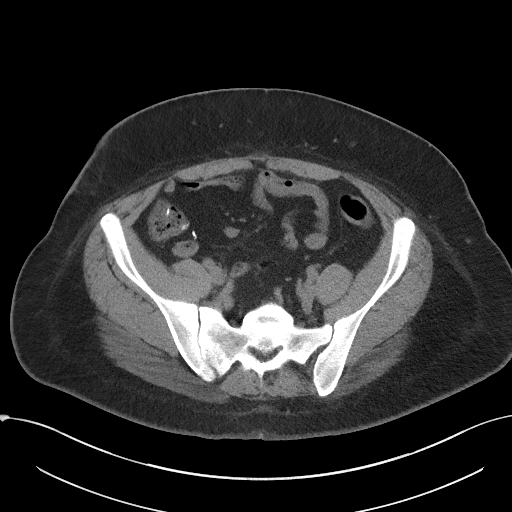
[im 43/98  soft-tissue]
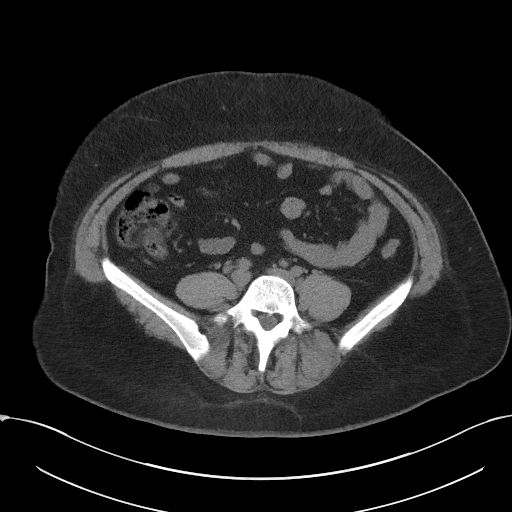
[im 51/98  soft-tissue]
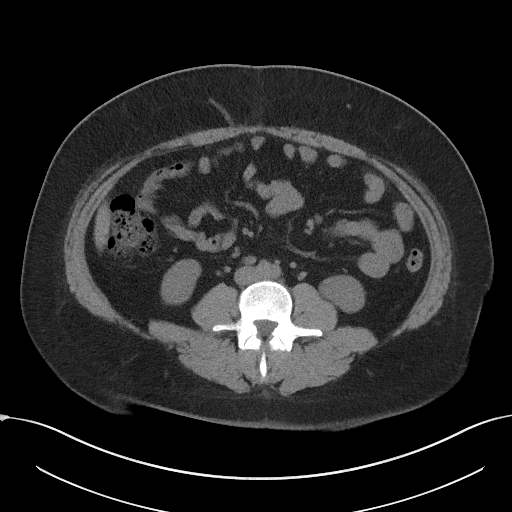
[im 55/98  soft-tissue]
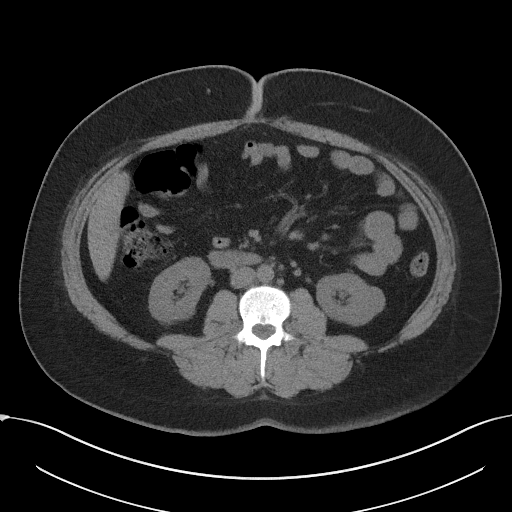
[im 64/98  soft-tissue]
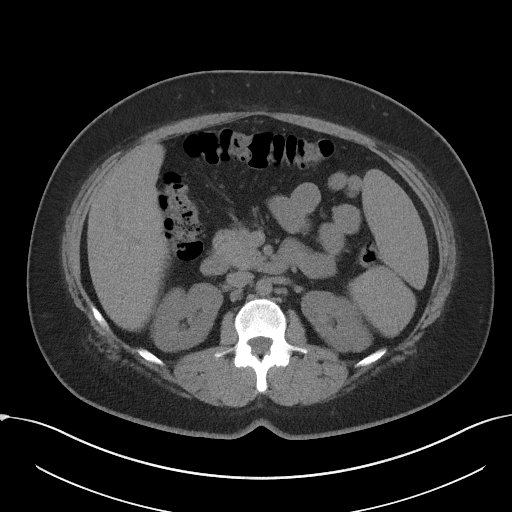
[im 64/98  bone]
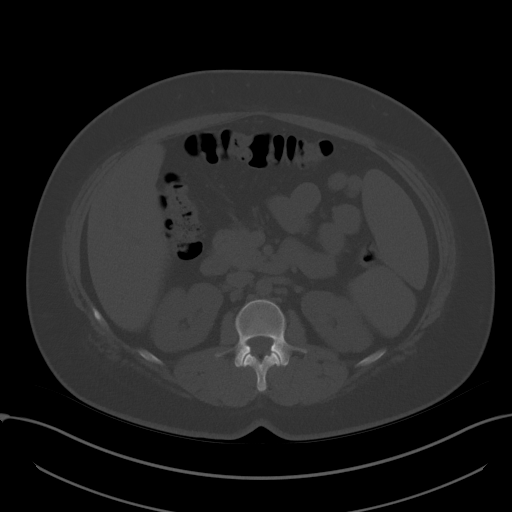
[im 72/98  soft-tissue]
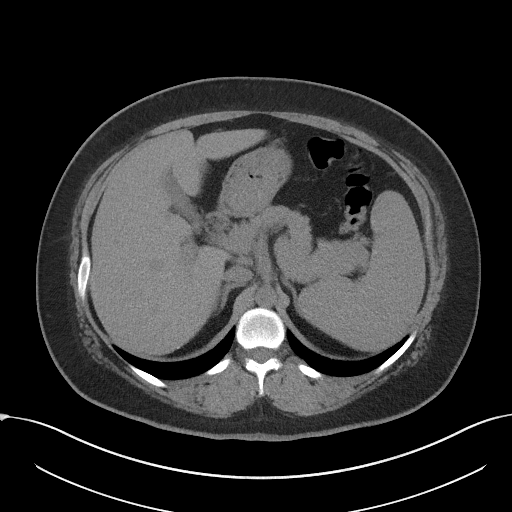
[im 76/98  soft-tissue]
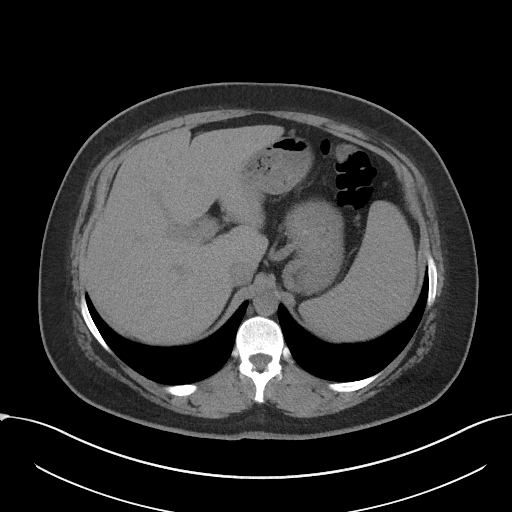
[im 85/98  soft-tissue]
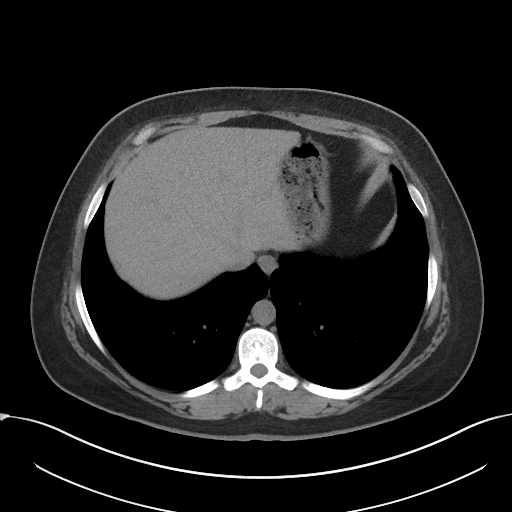
[im 93/98  soft-tissue]
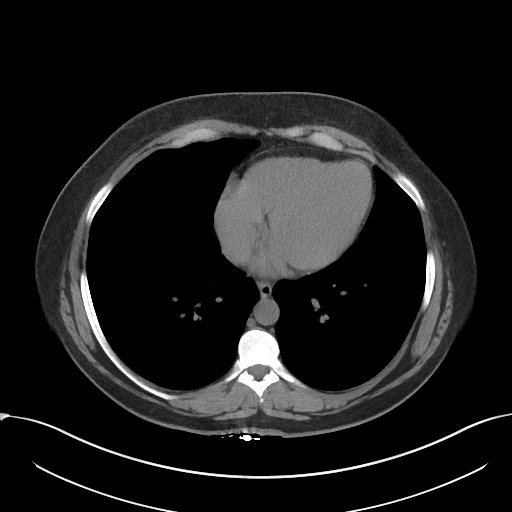

[Series 5: coronal · coronal · 0.84mm/px · 3 of 155 slices shown]
[im 52/155  soft-tissue]
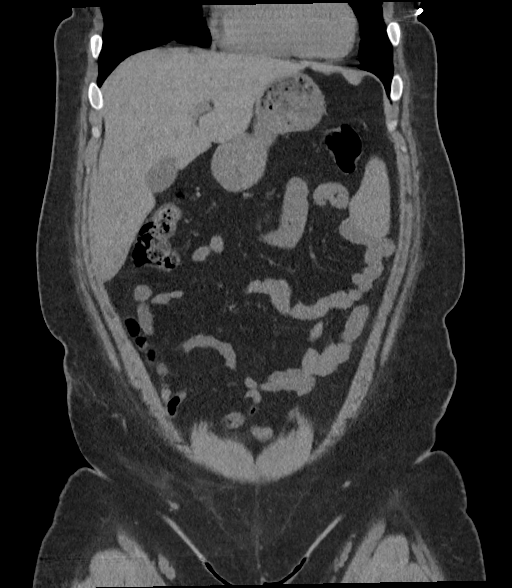
[im 69/155  soft-tissue]
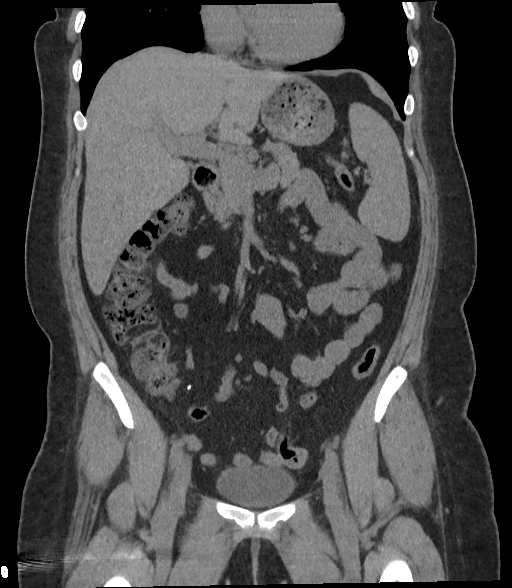
[im 86/155  soft-tissue]
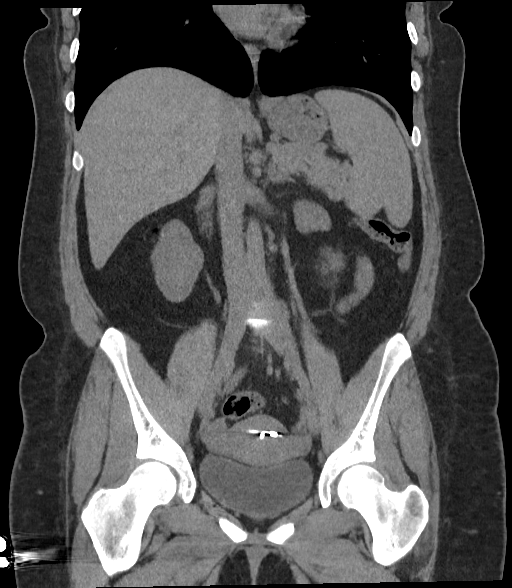

[16 of 46 positions shown; findings below may reference images not displayed]

FINDINGS: Lower chest: Included lung bases are clear.  Heart size is normal.

Hepatobiliary: Unremarkable unenhanced appearance of the liver. No
focal liver lesion identified. Gallbladder within normal limits. No
hyperdense gallstone. No biliary dilatation.

Pancreas: Unremarkable. No pancreatic ductal dilatation or
surrounding inflammatory changes.

Spleen: Unremarkable.

Adrenals/Urinary Tract: Unremarkable adrenal glands. Normal
unenhanced appearance of the bilateral kidneys. No focal lesion,
stone, or hydronephrosis. Unremarkable ureters. Ureteral calculi
identified. Urinary bladder within normal limits.

Stomach/Bowel: Stomach is within normal limits. Prior appendectomy.
No evidence of bowel wall thickening, distention, or inflammatory
changes.

Vascular/Lymphatic: No significant vascular findings are present. No
enlarged abdominal or pelvic lymph nodes. Mildly enlarged right
inguinal lymph node with adjacent fat stranding (series 2, image
82).

Reproductive: IUD present within the pelvis. No adnexal abnormality.

Other: No free fluid. No abdominopelvic fluid collection. No
pneumoperitoneum. No abdominal wall hernia.

Musculoskeletal: Transitional lumbosacral anatomy with partial
lumbarization of the S1 segment. No acute osseous abnormality.
IMPRESSION: 1. No acute abdominopelvic findings. Specifically, no evidence of
obstructive uropathy.
2. Mildly enlarged right inguinal lymph node with adjacent fat
stranding, which may be reactive and potentially represent a
lymphadenitis.
3. Prior appendectomy.

## 2022-01-04 DIAGNOSIS — Z419 Encounter for procedure for purposes other than remedying health state, unspecified: Secondary | ICD-10-CM | POA: Diagnosis not present

## 2022-02-03 DIAGNOSIS — Z419 Encounter for procedure for purposes other than remedying health state, unspecified: Secondary | ICD-10-CM | POA: Diagnosis not present

## 2022-07-15 ENCOUNTER — Emergency Department
Admission: EM | Admit: 2022-07-15 | Discharge: 2022-07-15 | Disposition: A | Discharge status: Home / Self Care | Payer: BC Managed Care – PPO | Attending: Emergency Medicine | Admitting: Emergency Medicine

## 2022-07-15 ENCOUNTER — Other Ambulatory Visit: Payer: Self-pay

## 2022-07-15 DIAGNOSIS — Z79899 Other long term (current) drug therapy: Secondary | ICD-10-CM | POA: Diagnosis not present

## 2022-07-15 DIAGNOSIS — R0789 Other chest pain: Secondary | ICD-10-CM | POA: Diagnosis present

## 2022-07-15 DIAGNOSIS — I10 Essential (primary) hypertension: Secondary | ICD-10-CM | POA: Insufficient documentation

## 2022-07-15 DIAGNOSIS — R079 Chest pain, unspecified: Secondary | ICD-10-CM

## 2022-07-15 LAB — CBC WITH DIFFERENTIAL/PLATELET
Abs Immature Granulocytes: 0.02 10*3/uL (ref 0.00–0.07)
Basophils Absolute: 0.1 10*3/uL (ref 0.0–0.1)
Basophils Relative: 1 %
Eosinophils Absolute: 0.1 10*3/uL (ref 0.0–0.5)
Eosinophils Relative: 1 %
HCT: 44.8 % (ref 36.0–46.0)
Hemoglobin: 15 g/dL (ref 12.0–15.0)
Immature Granulocytes: 0 %
Lymphocytes Relative: 25 %
Lymphs Abs: 2.4 10*3/uL (ref 0.7–4.0)
MCH: 29.1 pg (ref 26.0–34.0)
MCHC: 33.5 g/dL (ref 30.0–36.0)
MCV: 87 fL (ref 80.0–100.0)
Monocytes Absolute: 0.3 10*3/uL (ref 0.1–1.0)
Monocytes Relative: 3 %
Neutro Abs: 6.7 10*3/uL (ref 1.7–7.7)
Neutrophils Relative %: 70 %
Platelets: 235 10*3/uL (ref 150–400)
RBC: 5.15 MIL/uL — ABNORMAL HIGH (ref 3.87–5.11)
RDW: 13.6 % (ref 11.5–15.5)
WBC: 9.5 10*3/uL (ref 4.0–10.5)
nRBC: 0 % (ref 0.0–0.2)

## 2022-07-15 LAB — BASIC METABOLIC PANEL
Anion gap: 7 (ref 5–15)
BUN: 15 mg/dL (ref 6–20)
CO2: 20 mmol/L — ABNORMAL LOW (ref 22–32)
Calcium: 9 mg/dL (ref 8.9–10.3)
Chloride: 110 mmol/L (ref 98–111)
Creatinine, Ser: 1.32 mg/dL — ABNORMAL HIGH (ref 0.44–1.00)
GFR, Estimated: 57 mL/min — ABNORMAL LOW (ref >60)
Glucose, Bld: 114 mg/dL — ABNORMAL HIGH (ref 70–99)
Potassium: 4.1 mmol/L (ref 3.5–5.1)
Sodium: 137 mmol/L (ref 135–145)

## 2022-07-15 LAB — TROPONIN I (HIGH SENSITIVITY): Troponin I (High Sensitivity): 13 ng/L (ref <18)

## 2022-07-15 MED ORDER — AMLODIPINE BESYLATE 10 MG PO TABS: 10.0000 mg | ORAL_TABLET | Freq: Every day | ORAL | 0 refills | Status: AC

## 2022-07-15 NOTE — ED Provider Notes (Signed)
[facility] Provider Note    Event Date/Time   First MD Initiated Contact with Patient 07/15/22 0827     (approximate)   History   Chief Complaint: Chest Pain   HPI  Carly Taylor is a 27 y.o. female with a history of hypertension, IgA nephropathy, psoriasis who comes to the ED complaining of chest tightness in the center of the chest.  Not pleuritic, not exertional.  No shortness of breath diaphoresis or vomiting.  No radiation.  Started last night has been constant.  Has a history of hypertension, used to be on lisinopril/HCTZ but stopped taking it 2 weeks ago due to side effects.     Physical Exam   Triage Vital Signs: ED Triage Vitals  Enc Vitals Group     BP 07/15/22 0823 (!) 187/125     Pulse Rate 07/15/22 0823 91     Resp 07/15/22 0823 17     Temp 07/15/22 0823 98.1 F (36.7 C)     Temp Source 07/15/22 0823 Oral     SpO2 07/15/22 0823 97 %     Weight 07/15/22 0818 188 lb 0.8 oz (85.3 kg)     Height 07/15/22 0818 '5\' 4"'$  (1.626 m)     Head Circumference --      Peak Flow --      Pain Score 07/15/22 0818 8     Pain Loc --      Pain Edu? --      Excl. in [place]? --     Most recent vital signs: Vitals:   07/15/22 0830 07/15/22 1006  BP: (!) 168/125 (!) 166/98  Pulse: 98 75  Resp:  16  Temp:    SpO2: 95% 97%    General: Awake, no distress.  CV:  Good peripheral perfusion.  Regular rate rhythm, no murmurs Resp:  Normal effort.  Clear to auscultation bilaterally Abd:  No distention.  Soft nontender Other:  No lower extremity edema or calf tenderness.  Symmetric calf circumference   ED Results / Procedures / Treatments   Labs (all labs ordered are listed, but only abnormal results are displayed) Labs Reviewed  BASIC METABOLIC PANEL - Abnormal; Notable for the following components:      Result Value   CO2 20 (*)    Glucose, Bld 114 (*)    Creatinine, Ser 1.32 (*)    GFR, Estimated 57 (*)    All other components within normal  limits  CBC WITH DIFFERENTIAL/PLATELET - Abnormal; Notable for the following components:   RBC 5.15 (*)    All other components within normal limits  TROPONIN I (HIGH SENSITIVITY)     EKG Interpreted by me Normal sinus rhythm rate of 89.  Normal axis, normal intervals.  Poor R wave progression.  Normal ST segments and T waves.   RADIOLOGY Chest x-ray interpreted by me, appears normal.  Radiology report reviewed.   PROCEDURES:  Procedures   MEDICATIONS ORDERED IN ED: Medications - No data to display   IMPRESSION / MDM / [place] / ED COURSE  I reviewed the triage vital signs and the nursing notes.  DDx: Symptomatic hypertension, AKI, electrolyte abnormality, non-STEMI, pneumothorax, pneumonia  Patient's presentation is most consistent with acute presentation with potential threat to life or bodily function.  Patient presents with chest tightness, ongoing for the past 9 or 10 hours.  Low suspicion of ACS, but with uncontrolled hypertension will obtain a single troponin which should be sufficient for  AMI rule out at this point.  Not consistent with unstable angina.  Doubt PE, dissection, pericardial effusion.  Will plan to start on Norvasc for uncontrolled hypertension.   ----------------------------------------- 10:18 AM on 07/15/2022 ----------------------------------------- Labs unremarkable.  Stable for discharge.      FINAL CLINICAL IMPRESSION(S) / ED DIAGNOSES   Final diagnoses:  Uncontrolled hypertension  Nonspecific chest pain     Rx / DC Orders   ED Discharge Orders          Ordered    amLODipine (NORVASC) 10 MG tablet  Daily        07/15/22 1017             Note:  This document was prepared using Dragon voice recognition software and may include unintentional dictation errors.   Carrie Mew, MD 07/15/22 1018

## 2022-07-15 NOTE — ED Triage Notes (Signed)
Pt here with cp since last night. Pt states the pain is in center of her chest but does not radiates. Pt states she feels like something is sitting on her chest and is having constant pressure. Pt denies NVD.

## 2022-07-15 NOTE — Discharge Instructions (Addendum)
Your blood tests were okay today.  Start taking amlodipine to control your blood pressure, and follow-up with primary care to recheck blood pressure and adjust your medicine if needed.

## 2022-07-31 ENCOUNTER — Encounter: Payer: Self-pay | Admitting: Emergency Medicine

## 2022-07-31 ENCOUNTER — Emergency Department: Payer: BC Managed Care – PPO

## 2022-07-31 ENCOUNTER — Other Ambulatory Visit: Payer: Self-pay

## 2022-07-31 ENCOUNTER — Emergency Department
Admission: EM | Admit: 2022-07-31 | Discharge: 2022-07-31 | Disposition: A | Discharge status: Home / Self Care | Payer: BC Managed Care – PPO | Attending: Emergency Medicine | Admitting: Emergency Medicine

## 2022-07-31 DIAGNOSIS — R519 Headache, unspecified: Secondary | ICD-10-CM | POA: Diagnosis present

## 2022-07-31 DIAGNOSIS — I159 Secondary hypertension, unspecified: Secondary | ICD-10-CM

## 2022-07-31 DIAGNOSIS — R197 Diarrhea, unspecified: Secondary | ICD-10-CM | POA: Insufficient documentation

## 2022-07-31 DIAGNOSIS — I158 Other secondary hypertension: Secondary | ICD-10-CM | POA: Insufficient documentation

## 2022-07-31 DIAGNOSIS — R112 Nausea with vomiting, unspecified: Secondary | ICD-10-CM | POA: Diagnosis not present

## 2022-07-31 DIAGNOSIS — E860 Dehydration: Secondary | ICD-10-CM

## 2022-07-31 LAB — COMPREHENSIVE METABOLIC PANEL
ALT: 18 U/L (ref 0–44)
AST: 21 U/L (ref 15–41)
Albumin: 3.7 g/dL (ref 3.5–5.0)
Alkaline Phosphatase: 85 U/L (ref 38–126)
Anion gap: 8 (ref 5–15)
BUN: 10 mg/dL (ref 6–20)
CO2: 19 mmol/L — ABNORMAL LOW (ref 22–32)
Calcium: 8.9 mg/dL (ref 8.9–10.3)
Chloride: 112 mmol/L — ABNORMAL HIGH (ref 98–111)
Creatinine, Ser: 1.19 mg/dL — ABNORMAL HIGH (ref 0.44–1.00)
GFR, Estimated: >60 mL/min (ref >60)
Glucose, Bld: 109 mg/dL — ABNORMAL HIGH (ref 70–99)
Potassium: 3.6 mmol/L (ref 3.5–5.1)
Sodium: 139 mmol/L (ref 135–145)
Total Bilirubin: 0.9 mg/dL (ref 0.3–1.2)
Total Protein: 7 g/dL (ref 6.5–8.1)

## 2022-07-31 LAB — URINALYSIS, ROUTINE W REFLEX MICROSCOPIC
Bacteria, UA: NONE SEEN
Bilirubin Urine: NEGATIVE
Glucose, UA: NEGATIVE mg/dL
Hgb urine dipstick: NEGATIVE
Ketones, ur: NEGATIVE mg/dL
Leukocytes,Ua: NEGATIVE
Nitrite: NEGATIVE
Protein, ur: >=300 mg/dL — AB
Specific Gravity, Urine: 1.023 (ref 1.005–1.030)
pH: 5 (ref 5.0–8.0)

## 2022-07-31 LAB — CBC
HCT: 44.7 % (ref 36.0–46.0)
Hemoglobin: 15.5 g/dL — ABNORMAL HIGH (ref 12.0–15.0)
MCH: 29.1 pg (ref 26.0–34.0)
MCHC: 34.7 g/dL (ref 30.0–36.0)
MCV: 83.9 fL (ref 80.0–100.0)
Platelets: 275 10*3/uL (ref 150–400)
RBC: 5.33 MIL/uL — ABNORMAL HIGH (ref 3.87–5.11)
RDW: 12.8 % (ref 11.5–15.5)
WBC: 14.6 10*3/uL — ABNORMAL HIGH (ref 4.0–10.5)
nRBC: 0 % (ref 0.0–0.2)

## 2022-07-31 LAB — LIPASE, BLOOD: Lipase: 26 U/L (ref 11–51)

## 2022-07-31 LAB — POC URINE PREG, ED
Preg Test, Ur: NEGATIVE
Preg Test, Ur: NEGATIVE

## 2022-07-31 MED ORDER — PROMETHAZINE HCL 25 MG RE SUPP: 25.0000 mg | Freq: Four times a day (QID) | RECTAL | 0 refills | Status: AC | PRN

## 2022-07-31 MED ORDER — SODIUM CHLORIDE 0.9 % IV BOLUS
1000.0000 mL | Freq: Once | INTRAVENOUS | Status: AC
  Administered 2022-07-31: 1000 mL via INTRAVENOUS

## 2022-07-31 MED ORDER — CLONIDINE HCL 0.1 MG PO TABS: 0.1000 mg | ORAL_TABLET | Freq: Once | ORAL | Status: DC

## 2022-07-31 MED ORDER — DIPHENHYDRAMINE HCL 50 MG/ML IJ SOLN
25.0000 mg | Freq: Once | INTRAMUSCULAR | Status: AC
  Administered 2022-07-31: 25 mg via INTRAVENOUS
  Filled 2022-07-31: qty 1

## 2022-07-31 MED ORDER — LABETALOL HCL 5 MG/ML IV SOLN
10.0000 mg | Freq: Once | INTRAVENOUS | Status: AC
  Administered 2022-07-31: 10 mg via INTRAVENOUS
  Filled 2022-07-31: qty 4

## 2022-07-31 MED ORDER — ONDANSETRON 4 MG PO TBDP: 4.0000 mg | ORAL_TABLET | Freq: Three times a day (TID) | ORAL | 0 refills | Status: AC | PRN

## 2022-07-31 MED ORDER — PROCHLORPERAZINE EDISYLATE 10 MG/2ML IJ SOLN
10.0000 mg | Freq: Once | INTRAMUSCULAR | Status: AC
  Administered 2022-07-31: 10 mg via INTRAVENOUS
  Filled 2022-07-31: qty 2

## 2022-07-31 MED ORDER — LABETALOL HCL 5 MG/ML IV SOLN
20.0000 mg | Freq: Once | INTRAVENOUS | Status: AC
  Administered 2022-07-31: 20 mg via INTRAVENOUS
  Filled 2022-07-31: qty 4

## 2022-07-31 MED ORDER — CLONIDINE HCL 0.1 MG PO TABS
0.2000 mg | ORAL_TABLET | Freq: Once | ORAL | Status: AC
  Administered 2022-07-31: 0.2 mg via ORAL
  Filled 2022-07-31: qty 2

## 2022-07-31 MED ORDER — LISINOPRIL 10 MG PO TABS: 10.0000 mg | ORAL_TABLET | Freq: Once | ORAL | Status: DC

## 2022-07-31 MED ORDER — AMLODIPINE BESYLATE 5 MG PO TABS
10.0000 mg | ORAL_TABLET | Freq: Once | ORAL | Status: AC
  Administered 2022-07-31: 10 mg via ORAL
  Filled 2022-07-31: qty 2

## 2022-07-31 MED ORDER — DROPERIDOL 2.5 MG/ML IJ SOLN: 2.5000 mg | Freq: Once | INTRAMUSCULAR | Status: DC

## 2022-07-31 NOTE — ED Notes (Signed)
Pt taken to ct via w/c with ct tech

## 2022-07-31 NOTE — ED Notes (Signed)
Pt taken to xray via stretcher and xray techs

## 2022-07-31 NOTE — ED Notes (Addendum)
Pt states that she started vomiting with diarrhea at 0200 this am, states that she hasn't had her bp medicine since Monday, states last week she had cough and congestion and thought she was getting better then started getting sick again this am at 0200. Pt reports that her mom wanted her to be checked out because she was reading about her symptoms and told her it could be her gallbladder, pt denies abd pain unless she is vomiting. Pt reports that her last episode of vomiting was prior to coming in the ER

## 2022-07-31 NOTE — ED Triage Notes (Signed)
Patient to ED for headache with diarrhea and vomiting since yesterday. Also had cold like symptoms for the past week. NAD noted in triage.

## 2022-07-31 NOTE — ED Provider Notes (Signed)
[facility] Provider Note    Event Date/Time   First MD Initiated Contact with Patient 07/31/22 [phone]     (approximate)   History   Headache   HPI  Carly Taylor is a 27 y.o. female  here with headache, nausea, vomiting, diarrhea. Pt reports that last week she had several days of fever, cough, congestion, and fatigue. She thought she was slightly getting better but over the past 2-3 days has had worsening abdominal pain, cramping, and n/v with inability to keep any meds down. She has a h/o severe HTN and has not been able to take her PO meds. She has had a mild, aching, generalized headache over the past 2 days. No new fevers. No numbness, weakness, or vision changes.       Physical Exam   Triage Vital Signs: ED Triage Vitals  Enc Vitals Group     BP 07/31/22 0752 (!) 190/134     Pulse Rate 07/31/22 0752 95     Resp 07/31/22 0752 18     Temp 07/31/22 0752 98.1 F (36.7 C)     Temp Source 07/31/22 0752 Oral     SpO2 07/31/22 0752 96 %     Weight --      Height --      Head Circumference --      Peak Flow --      Pain Score 07/31/22 0751 8     Pain Loc --      Pain Edu? --      Excl. in [place]? --     Most recent vital signs: Vitals:   07/31/22 1030 07/31/22 1100  BP: (!) 198/126 (!) 185/123  Pulse: 90 91  Resp:    Temp:    SpO2: 98% 97%     General: Awake, no distress.  CV:  Good peripheral perfusion. RRR. Resp:  Normal work of breathing. Slight expiratory wheezes noted. Abd:  No distention. No significant rebound or guarding. No RLQ TTP.  Other:  Face is symmetric. EOMI. CNII-XII intact. Strength 5/5 bl UE and LE. Normal sensation to light touch. Normal gait.   ED Results / Procedures / Treatments   Labs (all labs ordered are listed, but only abnormal results are displayed) Labs Reviewed  COMPREHENSIVE METABOLIC PANEL - Abnormal; Notable for the following components:      Result Value   Chloride 112 (*)    CO2 19 (*)     Glucose, Bld 109 (*)    Creatinine, Ser 1.19 (*)    All other components within normal limits  CBC - Abnormal; Notable for the following components:   WBC 14.6 (*)    RBC 5.33 (*)    Hemoglobin 15.5 (*)    All other components within normal limits  URINALYSIS, ROUTINE W REFLEX MICROSCOPIC - Abnormal; Notable for the following components:   Color, Urine YELLOW (*)    APPearance HAZY (*)    Protein, ur >=300 (*)    All other components within normal limits  LIPASE, BLOOD  POC URINE PREG, ED  POC URINE PREG, ED     EKG    RADIOLOGY CXR: Clear CT Head: NAICA  I also independently reviewed and agree with radiologist interpretations.   PROCEDURES:  Critical Care performed: No  .1-3 Lead EKG Interpretation  Performed by: Duffy Bruce, MD Authorized by: Duffy Bruce, MD     Interpretation: normal     ECG rate:  70-90   ECG rate assessment:  normal     Rhythm: sinus rhythm     Ectopy: none     Conduction: normal   Comments:     Indication: Hypertension     MEDICATIONS ORDERED IN ED: Medications  sodium chloride 0.9 % bolus 1,000 mL (0 mLs Intravenous Stopped 07/31/22 0951)  labetalol (NORMODYNE) injection 10 mg (10 mg Intravenous Given 07/31/22 0852)  prochlorperazine (COMPAZINE) injection 10 mg (10 mg Intravenous Given 07/31/22 0852)  diphenhydrAMINE (BENADRYL) injection 25 mg (25 mg Intravenous Given 07/31/22 0851)  labetalol (NORMODYNE) injection 20 mg (20 mg Intravenous Given 07/31/22 0944)  amLODipine (NORVASC) tablet 10 mg (10 mg Oral Given 07/31/22 0944)  cloNIDine (CATAPRES) tablet 0.2 mg (0.2 mg Oral Given 07/31/22 1020)     IMPRESSION / MDM / ASSESSMENT AND PLAN / ED COURSE  I reviewed the triage vital signs and the nursing notes.                              Differential diagnosis includes, but is not limited to, viral GI illness, food-borne illness, enteritis, HTN urgency, intracranial bleed/mass/lesion  Patient's presentation is most consistent  with acute presentation with potential threat to life or bodily function.  The patient is on the cardiac monitor to evaluate for evidence of arrhythmia and/or significant heart rate changes  27 yo F with h/o HTN here with n/v/d. Suspect viral GI illness with known sick contacts. She is tolerating PO and feels much better in ED. lab work shows mild likely reactive leukocytosis with some degree of hemoconcentration.  CMP is consistent with this as well with mildly decreased bicarb of 19 but is otherwise reassuring.  Renal function is at baseline.  LFTs are normal.  Urinalysis largely unremarkable.  She has persistent proteinuria likely related to her underlying renal disease and this is likely etiology for significant hypertension as well.  No bacteria and she has no urinary symptoms.  Do not suspect UTI.  Urine pregnancy negative.  Patient feels much better in the ED and is tolerating p.o.  She has had complete resolution of her headache as well as nausea.  She has been able to take her usual blood pressure medications.  She does remain significantly hypertensive.  This was discussed with her in detail.  She is adamant that this is not too far off of her baseline and that it is because she has been missing medications.  She does not wish to stay for further treatment of this and states that she can follow-up very closely with her nephrologist who is well aware of this.  Given I do not suspect is contributing to her current symptoms of nausea and diarrhea, do not suspect she has an acute hypertensive emergency.  She was given additional additional doses of antihypertensives and discharged.   FINAL CLINICAL IMPRESSION(S) / ED DIAGNOSES   Final diagnoses:  Nausea vomiting and diarrhea  Dehydration  Secondary hypertension     Duffy Bruce, MD 07/31/22 1204

## 2022-08-09 ENCOUNTER — Ambulatory Visit (LOCAL_COMMUNITY_HEALTH_CENTER): Payer: BC Managed Care – PPO | Admitting: Advanced Practice Midwife

## 2022-08-09 ENCOUNTER — Encounter: Payer: Self-pay | Admitting: Advanced Practice Midwife

## 2022-08-09 VITALS — BP 152/99 | HR 83 | Temp 98.1°F | Resp 18 | Ht 64.0 in | Wt 201.0 lb

## 2022-08-09 DIAGNOSIS — F17200 Nicotine dependence, unspecified, uncomplicated: Secondary | ICD-10-CM

## 2022-08-09 DIAGNOSIS — Z1388 Encounter for screening for disorder due to exposure to contaminants: Secondary | ICD-10-CM | POA: Diagnosis not present

## 2022-08-09 DIAGNOSIS — Z3009 Encounter for other general counseling and advice on contraception: Secondary | ICD-10-CM | POA: Diagnosis not present

## 2022-08-09 DIAGNOSIS — Z30432 Encounter for removal of intrauterine contraceptive device: Secondary | ICD-10-CM

## 2022-08-09 DIAGNOSIS — Z308 Encounter for other contraceptive management: Secondary | ICD-10-CM

## 2022-08-09 DIAGNOSIS — Z0389 Encounter for observation for other suspected diseases and conditions ruled out: Secondary | ICD-10-CM | POA: Diagnosis not present

## 2022-08-09 DIAGNOSIS — I159 Secondary hypertension, unspecified: Secondary | ICD-10-CM

## 2022-08-09 LAB — WET PREP FOR TRICH, YEAST, CLUE
Trichomonas Exam: NEGATIVE
Yeast Exam: NEGATIVE

## 2022-08-09 LAB — HM HIV SCREENING LAB: HM HIV Screening: NEGATIVE

## 2022-08-09 LAB — HM HEPATITIS C SCREENING LAB: HM Hepatitis Screen: NEGATIVE

## 2022-08-09 NOTE — Progress Notes (Signed)
Patient is here today for annual exam and IUD removal. IUD (paragard) was inserted 01/10/17. She wishes removal to "let her body have a break from birthcontrol" and she plans abstinence while her partner is in treatment for the next 2 years. Consent form signed for abstinence.    Patient desires STD testing today.   Wet mount reviewed - no treatment indicated.   Earlyne Iba, RN
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
## 2022-08-27 NOTE — Addendum Note (Signed)
Addended by: Arnetha Courser on: 08/27/2022 01:26 PM   Modules accepted: Level of Service

## 2022-10-05 DIAGNOSIS — Z419 Encounter for procedure for purposes other than remedying health state, unspecified: Secondary | ICD-10-CM | POA: Diagnosis not present

## 2022-11-04 DIAGNOSIS — Z419 Encounter for procedure for purposes other than remedying health state, unspecified: Secondary | ICD-10-CM | POA: Diagnosis not present

## 2022-12-05 DIAGNOSIS — Z419 Encounter for procedure for purposes other than remedying health state, unspecified: Secondary | ICD-10-CM | POA: Diagnosis not present

## 2022-12-13 ENCOUNTER — Other Ambulatory Visit: Payer: Self-pay

## 2022-12-13 ENCOUNTER — Encounter: Payer: Self-pay | Admitting: Emergency Medicine

## 2022-12-13 ENCOUNTER — Emergency Department
Admission: EM | Admit: 2022-12-13 | Discharge: 2022-12-13 | Disposition: A | Discharge status: Home / Self Care | Payer: BC Managed Care – PPO | Attending: Emergency Medicine | Admitting: Emergency Medicine

## 2022-12-13 DIAGNOSIS — I10 Essential (primary) hypertension: Secondary | ICD-10-CM | POA: Diagnosis not present

## 2022-12-13 DIAGNOSIS — M722 Plantar fascial fibromatosis: Secondary | ICD-10-CM | POA: Diagnosis not present

## 2022-12-13 DIAGNOSIS — M79671 Pain in right foot: Secondary | ICD-10-CM | POA: Diagnosis present

## 2022-12-13 NOTE — ED Notes (Signed)
See triage note  Presents with pain to bottom right foot  Denies any injury  States she woke up with pain  Increased pain with standing  No deformity noted  Good pulses

## 2022-12-13 NOTE — Discharge Instructions (Signed)
It is very important to wear supportive shoes as we discussed.  You may also ice the bottom of your foot as we discussed, 20 minutes on, 20 minutes off as many times a day as you can.  If your symptoms persist, you may follow-up with the podiatrist.  Please return for any new, worsening, or change in symptoms or other concerns.  It was a pleasure caring for you today.
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
## 2022-12-13 NOTE — ED Triage Notes (Signed)
Pt comes with c/o foot pain. Pt states right foot pain. Pt states this all started few weeks ago off and on. Pt denies any known injuries. Pt states last night and her toes turned a purple color so she came in today.

## 2023-01-05 DIAGNOSIS — Z419 Encounter for procedure for purposes other than remedying health state, unspecified: Secondary | ICD-10-CM | POA: Diagnosis not present

## 2023-02-04 DIAGNOSIS — Z419 Encounter for procedure for purposes other than remedying health state, unspecified: Secondary | ICD-10-CM | POA: Diagnosis not present

## 2023-03-07 DIAGNOSIS — Z419 Encounter for procedure for purposes other than remedying health state, unspecified: Secondary | ICD-10-CM | POA: Diagnosis not present

## 2023-03-13 ENCOUNTER — Ambulatory Visit: Payer: BC Managed Care – PPO | Admitting: Advanced Practice Midwife

## 2023-03-13 ENCOUNTER — Encounter: Payer: Self-pay | Admitting: Advanced Practice Midwife

## 2023-03-13 VITALS — BP 165/110 | HR 80 | Ht 64.0 in | Wt 200.4 lb

## 2023-03-13 DIAGNOSIS — Z308 Encounter for other contraceptive management: Secondary | ICD-10-CM | POA: Diagnosis not present

## 2023-03-13 DIAGNOSIS — Z30430 Encounter for insertion of intrauterine contraceptive device: Secondary | ICD-10-CM

## 2023-03-13 MED ORDER — PARAGARD INTRAUTERINE COPPER IU IUD
1.0000 | INTRAUTERINE_SYSTEM | Freq: Once | INTRAUTERINE | Status: AC
  Administered 2023-03-13: 1 via INTRAUTERINE

## 2023-03-13 NOTE — Progress Notes (Signed)
[facility] [address] Main Number: [phone]  Contraception/Family Planning VISIT ENCOUNTER NOTE  Subjective:   Carly Taylor is a 27 y.o. SWF smoker  [id number] female here for reproductive life counseling. The patient is currently using Abstinence to prevent pregnancy.   She had her Paraguard removed 08/09/22 to give her "body a rest" and now wants another because her partner is coming this weekend for a home visit and will graduate 11/2023 from a 2 year tx program at [facility]. Smoking 1/2 ppd. LMP 03/07/23. Last sex 2 years ago. Doesn't want any more children. Went to ER 07/2022 with BP 212/140 and began Amlodipine 10 mg on 07/13/22 but forgot to take this am. Next apt with MD is 04/2023. Last vaped this am. Last cigar age 19. Last MJ 02/2023. Last ETOH 4 months ago (3 shots whiskey). Last PE 08/09/22. Last pap 06/11/21 neg.  The patient does not want a pregnancy in the next year.    Client states they are looking for the following:  High efficacy at preventing pregnancy  Denies abnormal vaginal bleeding, discharge, pelvic pain, problems with intercourse or other gynecologic concerns.    Gynecologic History No LMP recorded.  Health Maintenance Due  Topic Date Due   COVID-19 Vaccine (1) Never done   DTaP/Tdap/Td (1 - Tdap) Never done   INFLUENZA VACCINE  12/05/2022     The following portions of the patient's history were reviewed and updated as appropriate: allergies, current medications, past family history, past medical history, past social history, past surgical history and problem list.  Review of Systems Pertinent items are noted in HPI.   Objective:  BP (!) 165/110 (BP Location: Right Arm, Patient Position: Sitting, Cuff Size: Large)   Pulse 80   Ht 5\' 4"  (1.626 m)   Wt 200 lb 6.4 oz (90.9 kg)   BMI 34.40 kg/m  Gen: well appearing, NAD HEENT: no scleral icterus CV: RR Lung: Normal WOB Ext: warm well  perfused  PELVIC: Normal appearing external genitalia; normal appearing vaginal mucosa and cervix.  No abnormal discharge noted.  Pap smear not obtained.  Normal uterine size, no other palpable masses, no uterine or adnexal tenderness. GC/Chlamydia cultures obtained.   Assessment and Plan:   Need for emergency contraceptive care was assessed today.  Last unprotected sex was:  2022  Patient reported not meeting criteria.  Reviewed options and patient desired Cu-IUD   Contraception counseling: Reviewed methods in a patient centered fashion and used shared decision making with the patient. Utilized [employer] patient education tools as appropriate. The patient stated there goals and desires from a method are: High efficacy at preventing pregnancy  We reviewed the following methods in detail based on patient preferences available included: IUD or IUS  Patient expressed they would like IUD or IUS  This was provided to the patient today.  if not why not clearly documented  Risks, benefits, and typical effectiveness rates were reviewed.  Questions were answered.  Written information was also given to the patient to review.     she/her/hers  will follow up in  prn     for surveillance.  she/her/hers was told to call with any further questions, or with any concerns about this method of contraception or cycle control.  Emphasized use of condoms 100% of the time for STI prevention.   1. Encounter for insertion of intrauterine contraceptive device (IUD)  GC/Chlamydia cultures obtained.  Counseled via 5 A's to  stop smoking Pt requests # for obtaining BTL from [facility] or [facility]--given Pt counseled to take BP meds daily; has f/u with MD 04/2023  Patient presented to [facility] for IUD insertion. Her GC/CT screening was found to be up to date and using WHO criteria we can be reasonably certain she is not pregnant or a pregnancy test was obtained which was Urine pregnancy test  today was N/A.  See Flowsheet for  IUD check list  IUD Insertion Procedure Note: Paraguard Patient identified, informed consent performed, consent signed.   Discussed risks of irregular bleeding, cramping, infection, malpositioning or misplacement of the IUD outside the uterus which may require further procedure such as laparoscopy. Time out was performed.    Speculum placed in the vagina.  Cervix visualized.  Cleaned with Betadine x 2.  Cervix Grasped anteriorly with a single tooth tenaculum.  Uterus sounded to 8 cm.  IUD placed per manufacturer's recommendations.  Strings trimmed to 3 cm. Alice clamp was removed, good hemostasis noted.  Patient tolerated procedure well.   Patient was given post-procedure instructions- both agency handout and verbally by provider.  She was advised to have backup contraception for one week.  Patient was also asked to check IUD strings periodically or follow up in 4 weeks for IUD check.  - Chlamydia/Gonorrhea [facility] - paragard intrauterine copper IUD 1 each    Please refer to After Visit Summary for other counseling recommendations.   No follow-ups on file.  Alberteen Spindle, CNM [facility] DEPARTMENT

## 2023-03-13 NOTE — Progress Notes (Signed)
Pt is here for iud insertion only, condoms given. Gaspar Garbe, RN

## 2023-04-06 DIAGNOSIS — Z419 Encounter for procedure for purposes other than remedying health state, unspecified: Secondary | ICD-10-CM | POA: Diagnosis not present

## 2023-04-23 NOTE — Addendum Note (Signed)
Addended by: Arnetha Courser on: 04/23/2023 04:49 PM   Modules accepted: Level of Service

## 2023-05-07 DIAGNOSIS — Z419 Encounter for procedure for purposes other than remedying health state, unspecified: Secondary | ICD-10-CM | POA: Diagnosis not present

## 2023-05-08 ENCOUNTER — Ambulatory Visit
Admission: EM | Admit: 2023-05-08 | Discharge: 2023-05-08 | Disposition: A | Discharge status: Home / Self Care | Payer: Medicaid Other | Attending: Family Medicine | Admitting: Family Medicine

## 2023-05-08 NOTE — ED Notes (Signed)
 Called pt in lobby x 2 and by phone with no answer x 1.

## 2023-05-08 NOTE — ED Notes (Signed)
 Attempted again to call pt x 1 with no answer.

## 2023-05-12 ENCOUNTER — Emergency Department
Admission: EM | Admit: 2023-05-12 | Discharge: 2023-05-12 | Disposition: A | Discharge status: Home / Self Care | Payer: Medicaid Other | Attending: Emergency Medicine | Admitting: Emergency Medicine

## 2023-05-12 ENCOUNTER — Emergency Department: Payer: Medicaid Other

## 2023-05-12 ENCOUNTER — Other Ambulatory Visit: Payer: Self-pay

## 2023-05-12 ENCOUNTER — Encounter: Payer: Self-pay | Admitting: Emergency Medicine

## 2023-05-12 DIAGNOSIS — Y9351 Activity, roller skating (inline) and skateboarding: Secondary | ICD-10-CM | POA: Diagnosis not present

## 2023-05-12 DIAGNOSIS — I10 Essential (primary) hypertension: Secondary | ICD-10-CM | POA: Insufficient documentation

## 2023-05-12 DIAGNOSIS — S6992XA Unspecified injury of left wrist, hand and finger(s), initial encounter: Secondary | ICD-10-CM | POA: Diagnosis present

## 2023-05-12 DIAGNOSIS — F17210 Nicotine dependence, cigarettes, uncomplicated: Secondary | ICD-10-CM | POA: Insufficient documentation

## 2023-05-12 DIAGNOSIS — S63502A Unspecified sprain of left wrist, initial encounter: Secondary | ICD-10-CM | POA: Insufficient documentation

## 2023-05-12 DIAGNOSIS — M25532 Pain in left wrist: Secondary | ICD-10-CM | POA: Diagnosis not present

## 2023-05-12 NOTE — ED Triage Notes (Signed)
 Pt in via POV, reports falling [place] skating rink on Saturday, landing on left wrist.  Reports ongoing pain w/ ROM to that wrist, some swelling noted.  Ambulatory to triage, NAD noted at this time.

## 2023-05-12 NOTE — Discharge Instructions (Signed)
 Your x-ray does not reveal any broken bones.  Rest, ice, elevate your wrist.  Please return for any new, worsening, or change in symptoms or other concerns.  It was a pleasure caring for you today.
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
## 2023-05-12 NOTE — ED Notes (Signed)
 See triage notes. Patient c/o left wrist pain secondary to a fall on Saturday.

## 2023-06-07 DIAGNOSIS — Z419 Encounter for procedure for purposes other than remedying health state, unspecified: Secondary | ICD-10-CM | POA: Diagnosis not present

## 2023-07-05 DIAGNOSIS — Z419 Encounter for procedure for purposes other than remedying health state, unspecified: Secondary | ICD-10-CM | POA: Diagnosis not present

## 2023-08-14 ENCOUNTER — Emergency Department (HOSPITAL_COMMUNITY)

## 2023-08-14 ENCOUNTER — Emergency Department (HOSPITAL_COMMUNITY)
Admission: EM | Admit: 2023-08-14 | Discharge: 2023-08-14 | Discharge status: Against Medical Advice | Attending: Emergency Medicine | Admitting: Emergency Medicine

## 2023-08-14 ENCOUNTER — Emergency Department
Admission: EM | Admit: 2023-08-14 | Discharge: 2023-08-14 | Discharge status: Against Medical Advice | Attending: Emergency Medicine | Admitting: Emergency Medicine

## 2023-08-14 ENCOUNTER — Other Ambulatory Visit: Payer: Self-pay

## 2023-08-14 DIAGNOSIS — D72829 Elevated white blood cell count, unspecified: Secondary | ICD-10-CM | POA: Insufficient documentation

## 2023-08-14 DIAGNOSIS — R1031 Right lower quadrant pain: Secondary | ICD-10-CM | POA: Insufficient documentation

## 2023-08-14 DIAGNOSIS — Z743 Need for continuous supervision: Secondary | ICD-10-CM | POA: Diagnosis not present

## 2023-08-14 DIAGNOSIS — I10 Essential (primary) hypertension: Secondary | ICD-10-CM | POA: Diagnosis not present

## 2023-08-14 DIAGNOSIS — R1084 Generalized abdominal pain: Secondary | ICD-10-CM | POA: Diagnosis not present

## 2023-08-14 DIAGNOSIS — J45909 Unspecified asthma, uncomplicated: Secondary | ICD-10-CM | POA: Diagnosis not present

## 2023-08-14 DIAGNOSIS — R112 Nausea with vomiting, unspecified: Secondary | ICD-10-CM | POA: Diagnosis not present

## 2023-08-14 DIAGNOSIS — Z5321 Procedure and treatment not carried out due to patient leaving prior to being seen by health care provider: Secondary | ICD-10-CM | POA: Insufficient documentation

## 2023-08-14 LAB — URINALYSIS, W/ REFLEX TO CULTURE (INFECTION SUSPECTED)
Bacteria, UA: NONE SEEN
Bilirubin Urine: NEGATIVE
Glucose, UA: NEGATIVE mg/dL
Ketones, ur: NEGATIVE mg/dL
Leukocytes,Ua: NEGATIVE
Nitrite: NEGATIVE
Protein, ur: >=300 mg/dL — AB
Specific Gravity, Urine: 1.009 (ref 1.005–1.030)
pH: 5 (ref 5.0–8.0)

## 2023-08-14 LAB — CBC
HCT: 40.6 % (ref 36.0–46.0)
Hemoglobin: 13.6 g/dL (ref 12.0–15.0)
MCH: 28.9 pg (ref 26.0–34.0)
MCHC: 33.5 g/dL (ref 30.0–36.0)
MCV: 86.2 fL (ref 80.0–100.0)
Platelets: 253 10*3/uL (ref 150–400)
RBC: 4.71 MIL/uL (ref 3.87–5.11)
RDW: 13.9 % (ref 11.5–15.5)
WBC: 12.8 10*3/uL — ABNORMAL HIGH (ref 4.0–10.5)
nRBC: 0 % (ref 0.0–0.2)

## 2023-08-14 LAB — COMPREHENSIVE METABOLIC PANEL WITH GFR
ALT: 13 U/L (ref 0–44)
AST: 13 U/L — ABNORMAL LOW (ref 15–41)
Albumin: 3.2 g/dL — ABNORMAL LOW (ref 3.5–5.0)
Alkaline Phosphatase: 89 U/L (ref 38–126)
Anion gap: 7 (ref 5–15)
BUN: 18 mg/dL (ref 6–20)
CO2: 19 mmol/L — ABNORMAL LOW (ref 22–32)
Calcium: 8.7 mg/dL — ABNORMAL LOW (ref 8.9–10.3)
Chloride: 110 mmol/L (ref 98–111)
Creatinine, Ser: 1.53 mg/dL — ABNORMAL HIGH (ref 0.44–1.00)
GFR, Estimated: 48 mL/min — ABNORMAL LOW (ref >60)
Glucose, Bld: 102 mg/dL — ABNORMAL HIGH (ref 70–99)
Potassium: 4.2 mmol/L (ref 3.5–5.1)
Sodium: 136 mmol/L (ref 135–145)
Total Bilirubin: 0.3 mg/dL (ref 0.0–1.2)
Total Protein: 6.5 g/dL (ref 6.5–8.1)

## 2023-08-14 LAB — PREGNANCY, URINE: Preg Test, Ur: NEGATIVE

## 2023-08-14 LAB — LIPASE, BLOOD: Lipase: 30 U/L (ref 11–51)

## 2023-08-14 NOTE — ED Notes (Signed)
 Pt left AMA

## 2023-08-14 NOTE — ED Triage Notes (Signed)
 Pt. Stated, Im having RLQ pain with N/V . I went to [facility] too long to wait so I came here. They did blood work at [facility]

## 2023-08-14 NOTE — ED Notes (Signed)
Pt called x3 no response.  

## 2023-08-14 NOTE — ED Triage Notes (Addendum)
 Pt to ED via EMS from home, pt reports RLQ abd pain that began this morning. Pt denies vaginal bleeding or dysuria. Pt does have n/v. Pt previously had appendix removed. No pain with palpation. Pt has hx HTN, pt took medication prior to EMS arrival.

## 2023-08-14 NOTE — ED Provider Notes (Signed)
 [place] EMERGENCY DEPARTMENT AT [facility] Provider Note   CSN: [id number] Arrival date & time: 08/14/23  0757     History  Chief Complaint  Patient presents with   Abdominal Pain   Emesis   Nausea    Carly Taylor is a 28 y.o. female.  Patient is a 28 year old female with a history of hypertension, IgA nephropathy, ovarian cysts, hypertension, asthma, anemia who is presenting today with right lower quadrant pain.  She reports the pain started spontaneously last night and woke her from sleep.  The pain has been waxing and waning but when it is severe it is caused her to vomit several times.  She has noted this week that she has vomited almost every morning for the last few days which is unusual for her but has not had vomiting throughout the rest of the day and has not felt sick.  Again she has not had abdominal pain since last night.  She does report urinating frequently but feels that this is related to the diuretic that she is on for her blood pressure.  She has not had a fever or diarrhea and denies any flank pain.  She reports that laying down seems to make the pain worse and standing and walking around but sitting is very comfortable and the pain is almost gone.  She reports prior appendectomy.  She did go to [facility] last night and reports was there for 6 hours and they did blood work but she was still waiting and decided to leave because she was uncomfortable.  The history is provided by the patient.       Home Medications Prior to Admission medications   Medication Sig Start Date End Date Taking? Authorizing Provider  acetaminophen (TYLENOL) 500 MG tablet Take 1,000 mg by mouth every 6 (six) hours as needed for headache. Patient not taking: Reported on 03/13/2023    [provider]  albuterol (PROVENTIL) (5 MG/ML) 0.5% nebulizer solution Take 0.5 mLs (2.5 mg total) by nebulization every 6 (six) hours as needed for wheezing or shortness of  breath. 04/16/20   Georgetta Haber, NP  albuterol (VENTOLIN HFA) 108 (90 Base) MCG/ACT inhaler Inhale 2 puffs into the lungs every 4 (four) hours as needed for wheezing or shortness of breath. 04/16/20   Georgetta Haber, NP  amLODipine (NORVASC) 10 MG tablet Take 1 tablet (10 mg total) by mouth daily. 07/15/22 10/13/22  Sharman Cheek, MD  ondansetron (ZOFRAN-ODT) 4 MG disintegrating tablet Take 1 tablet (4 mg total) by mouth every 8 (eight) hours as needed for nausea or vomiting. Patient not taking: Reported on 03/13/2023 07/31/22   Shaune Pollack, MD  promethazine (PHENERGAN) 25 MG suppository Place 1 suppository (25 mg total) rectally every 6 (six) hours as needed for refractory nausea / vomiting. Patient not taking: Reported on 08/09/2022 07/31/22   Shaune Pollack, MD      Allergies    Nsaids and Tolmetin    Review of Systems   Review of Systems  Physical Exam Updated Vital Signs BP (!) 193/124   Pulse 80   Temp 98.3 F (36.8 C)   Resp 18   LMP 08/12/2023   SpO2 100%  Physical Exam Vitals and nursing note reviewed.  Constitutional:      General: She is not in acute distress.    Appearance: She is well-developed.  HENT:     Head: Normocephalic and atraumatic.  Eyes:     Pupils: Pupils are equal,  round, and reactive to light.  Cardiovascular:     Rate and Rhythm: Normal rate and regular rhythm.     Heart sounds: Normal heart sounds. No murmur heard.    No friction rub.  Pulmonary:     Effort: Pulmonary effort is normal.     Breath sounds: Normal breath sounds. No wheezing or rales.  Abdominal:     General: Bowel sounds are normal. There is no distension.     Palpations: Abdomen is soft.     Tenderness: There is abdominal tenderness in the right lower quadrant. There is no right CVA tenderness, guarding or rebound.  Musculoskeletal:        General: No tenderness. Normal range of motion.     Comments: No edema  Skin:    General: Skin is warm and dry.     Findings: No  rash.  Neurological:     Mental Status: She is alert and oriented to person, place, and time.     Cranial Nerves: No cranial nerve deficit.  Psychiatric:        Behavior: Behavior normal.     ED Results / Procedures / Treatments   Labs (all labs ordered are listed, but only abnormal results are displayed) Labs Reviewed  URINALYSIS, W/ REFLEX TO CULTURE (INFECTION SUSPECTED) - Abnormal; Notable for the following components:      Result Value   Color, Urine STRAW (*)    Hgb urine dipstick SMALL (*)    Protein, ur >=300 (*)    All other components within normal limits  PREGNANCY, URINE    EKG None  Radiology No results found.  Procedures Procedures    Medications Ordered in ED Medications - No data to display  ED Course/ Medical Decision Making/ A&P                                 Medical Decision Making Amount and/or Complexity of Data Reviewed Labs: ordered. Decision-making details documented in ED Course.   Pt with multiple medical problems and comorbidities and presenting today with a complaint that caries a high risk for morbidity and mortality.  Here today with right sided pain.  Patient is prior appendectomy so suspicion for renal stone, ovarian cyst, ectopic pregnancy, cholecystitis.  Lower suspicion for pyelonephritis, AAA, ischemic bowel. Patient had lab work done at [facility] last night.  Lipase was normal, CMP at 1.5 which is slightly higher than her baseline at 1.1 with normal LFTs and electrolytes, CBC with minimal leukocytosis of 12.  Will check urine for signs of infection, blood or protein, also urine pregnancy test is pending.  Patient will need imaging and will start with noncontrast CT (avoid contrast because of her IgA number thyropathy) may need pelvic ultrasound if CT is normal.  12:57 PM UPT neg.  UA with some small Hb and >300 protein.  CT without ordered However pt eloped prior to imaging done.  I was not able ot discuss urine results  with pt or her persistent htn.         Final Clinical Impression(s) / ED Diagnoses Final diagnoses:  Right lower quadrant abdominal pain    Rx / DC Orders ED Discharge Orders     None         Gwyneth Sprout, MD 08/14/23 1257

## 2023-08-16 DIAGNOSIS — Z419 Encounter for procedure for purposes other than remedying health state, unspecified: Secondary | ICD-10-CM | POA: Diagnosis not present

## 2023-08-26 ENCOUNTER — Ambulatory Visit: Admitting: Obstetrics & Gynecology

## 2023-08-26 NOTE — Progress Notes (Signed)
 This 28 yo lady is here today to discuss irregular bleeding with her IUD.  However her BP is 200/132. She is treated for HTN and she took double her normal dose this morning.  I explained that this is a dangerously high BP and I cannot in good conscience do an elective gyn visit while she is at risk for a stroke.  I rec'd that she go directly to the ED and seek treatment.  I explained that I will work her in for a return visit any day I am here at her convenience.

## 2023-09-01 ENCOUNTER — Ambulatory Visit (INDEPENDENT_AMBULATORY_CARE_PROVIDER_SITE_OTHER): Admitting: Obstetrics & Gynecology

## 2023-09-01 ENCOUNTER — Other Ambulatory Visit (HOSPITAL_COMMUNITY)
Admission: RE | Admit: 2023-09-01 | Discharge: 2023-09-01 | Disposition: A | Discharge status: Home / Self Care | Source: Ambulatory Visit | Attending: Obstetrics & Gynecology | Admitting: Obstetrics & Gynecology

## 2023-09-01 VITALS — BP 177/135 | HR 76 | Ht 62.0 in | Wt 214.4 lb

## 2023-09-01 DIAGNOSIS — N926 Irregular menstruation, unspecified: Secondary | ICD-10-CM | POA: Insufficient documentation

## 2023-09-01 DIAGNOSIS — R102 Pelvic and perineal pain: Secondary | ICD-10-CM | POA: Diagnosis not present

## 2023-09-01 NOTE — Progress Notes (Signed)
    GYNECOLOGY PROGRESS NOTE  Subjective:    Patient ID: Carly Taylor, female    DOB: March 05, 1996, 28 y.o.   MRN: [id number]  HPI  Patient is a 28 y.o. single G1P1002  who presents for a 6 month h/o lower pelvis pain associated with essentially anything in her vagina (especially a tampon), but sometimes the same pain happens with sex. She also reports that for the last few months she has had 2 periods per month. She uses a period tracking app.  She has a paragard  IUD in place.  The following portions of the patient's history were reviewed and updated as appropriate: allergies, current medications, past family history, past medical history, past social history, past surgical history, and problem list.  Review of Systems Pertinent items are noted in HPI.  She has been monogamous for about 8 years.  She smokes 1/2 ppd. She [facility] and reports a normal pap within the last year.  Objective:   Blood pressure (!) 177/135, pulse 76, height 5\' 2"  (1.575 m), weight 214 lb 6.4 oz (97.3 kg), last menstrual period 08/07/2023. Body mass index is 39.21 kg/m. Well nourished, well hydrated White female, no apparent distress She is ambulating and conversing normally. Smoker's deep voice noted Abd- benign EG- normal Pederson speculum used and 3cm paragard  strings seen Normal discharge noted, no odor Bimanual exam- normal size and shape, anteverted uterus, normal adnexal exam. She reports tenderness with uterine palpation.  Assessment:  Pelvic pain Irregular periods  Plan:   Rec weight loss, management of BP and smoking cessation! Check pelvic ultrasound, cervical cultures, CBC and TSH. She will come back when ultrasound results are available.

## 2023-09-02 LAB — CBC
Hematocrit: 44.2 % (ref 34.0–46.6)
Hemoglobin: 14.2 g/dL (ref 11.1–15.9)
MCH: 28.4 pg (ref 26.6–33.0)
MCHC: 32.1 g/dL (ref 31.5–35.7)
MCV: 88 fL (ref 79–97)
Platelets: 279 10*3/uL (ref 150–450)
RBC: 5 x10E6/uL (ref 3.77–5.28)
RDW: 14.3 % (ref 11.7–15.4)
WBC: 10 10*3/uL (ref 3.4–10.8)

## 2023-09-02 LAB — CERVICOVAGINAL ANCILLARY ONLY
Bacterial Vaginitis (gardnerella): NEGATIVE
Candida Glabrata: NEGATIVE
Candida Vaginitis: NEGATIVE
Chlamydia: NEGATIVE
Comment: NEGATIVE
Comment: NEGATIVE
Comment: NEGATIVE
Comment: NEGATIVE
Comment: NEGATIVE
Comment: NORMAL
Neisseria Gonorrhea: NEGATIVE
Trichomonas: NEGATIVE

## 2023-09-02 LAB — TSH: TSH: 1.27 u[IU]/mL (ref 0.450–4.500)

## 2023-09-04 ENCOUNTER — Ambulatory Visit
Admission: RE | Admit: 2023-09-04 | Discharge: 2023-09-04 | Disposition: A | Discharge status: Home / Self Care | Source: Ambulatory Visit | Attending: Obstetrics & Gynecology | Admitting: Obstetrics & Gynecology

## 2023-09-04 DIAGNOSIS — R102 Pelvic and perineal pain: Secondary | ICD-10-CM | POA: Insufficient documentation

## 2023-09-04 DIAGNOSIS — N838 Other noninflammatory disorders of ovary, fallopian tube and broad ligament: Secondary | ICD-10-CM | POA: Diagnosis not present

## 2023-09-04 DIAGNOSIS — N926 Irregular menstruation, unspecified: Secondary | ICD-10-CM | POA: Diagnosis not present

## 2023-09-09 ENCOUNTER — Telehealth: Payer: Self-pay

## 2023-09-09 NOTE — Telephone Encounter (Signed)
 Patient called about her ultrasound results. I informed her that you would be back in office on Monday. She wants to discuss results and next steps. She verbalized understanding.

## 2023-09-15 DIAGNOSIS — Z419 Encounter for procedure for purposes other than remedying health state, unspecified: Secondary | ICD-10-CM | POA: Diagnosis not present

## 2023-09-16 ENCOUNTER — Telehealth: Payer: Self-pay | Admitting: Obstetrics & Gynecology

## 2023-09-16 DIAGNOSIS — R102 Pelvic and perineal pain: Secondary | ICD-10-CM

## 2023-09-16 DIAGNOSIS — N83201 Unspecified ovarian cyst, right side: Secondary | ICD-10-CM

## 2023-09-16 NOTE — Telephone Encounter (Signed)
 We discussed her ultrasound findings I will order pelvic PT for vaginal pain and a follow up ultrasound in 6 weeks.

## 2023-09-23 ENCOUNTER — Ambulatory Visit: Attending: Obstetrics & Gynecology

## 2023-09-23 ENCOUNTER — Other Ambulatory Visit: Payer: Self-pay

## 2023-09-23 DIAGNOSIS — R293 Abnormal posture: Secondary | ICD-10-CM | POA: Insufficient documentation

## 2023-09-23 DIAGNOSIS — R2689 Other abnormalities of gait and mobility: Secondary | ICD-10-CM | POA: Diagnosis not present

## 2023-09-23 DIAGNOSIS — N3946 Mixed incontinence: Secondary | ICD-10-CM | POA: Insufficient documentation

## 2023-09-23 DIAGNOSIS — M6281 Muscle weakness (generalized): Secondary | ICD-10-CM | POA: Diagnosis not present

## 2023-09-23 DIAGNOSIS — R102 Pelvic and perineal pain: Secondary | ICD-10-CM | POA: Insufficient documentation
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
## 2023-09-23 NOTE — Patient Instructions (Signed)

## 2023-09-30 ENCOUNTER — Ambulatory Visit

## 2023-10-09 ENCOUNTER — Ambulatory Visit: Attending: Obstetrics & Gynecology

## 2023-10-15 ENCOUNTER — Ambulatory Visit
Admission: RE | Admit: 2023-10-15 | Discharge: 2023-10-15 | Disposition: A | Discharge status: Home / Self Care | Source: Ambulatory Visit | Attending: Obstetrics & Gynecology | Admitting: Obstetrics & Gynecology

## 2023-10-15 DIAGNOSIS — N83201 Unspecified ovarian cyst, right side: Secondary | ICD-10-CM | POA: Diagnosis not present

## 2023-10-15 DIAGNOSIS — N838 Other noninflammatory disorders of ovary, fallopian tube and broad ligament: Secondary | ICD-10-CM | POA: Diagnosis not present

## 2023-10-16 ENCOUNTER — Ambulatory Visit

## 2023-10-16 DIAGNOSIS — Z419 Encounter for procedure for purposes other than remedying health state, unspecified: Secondary | ICD-10-CM | POA: Diagnosis not present

## 2023-10-21 ENCOUNTER — Encounter

## 2023-10-28 ENCOUNTER — Encounter

## 2023-11-04 ENCOUNTER — Encounter

## 2023-11-10 ENCOUNTER — Telehealth: Payer: Self-pay

## 2023-11-10 NOTE — Telephone Encounter (Signed)
 Patient called to follow up on ultrasound results for right ovarian cyst. Ultrasound completed on 10/15/2023. I informed patient that Dr. Starla is out of the office and as soon as she returns she will reach out to her about her results. She verbalized understanding.

## 2023-11-11 ENCOUNTER — Encounter

## 2023-11-15 DIAGNOSIS — Z419 Encounter for procedure for purposes other than remedying health state, unspecified: Secondary | ICD-10-CM | POA: Diagnosis not present

## 2023-11-25 ENCOUNTER — Encounter

## 2023-11-27 ENCOUNTER — Telehealth: Payer: Self-pay

## 2023-11-27 DIAGNOSIS — L409 Psoriasis, unspecified: Secondary | ICD-10-CM | POA: Diagnosis not present

## 2023-11-27 DIAGNOSIS — I10 Essential (primary) hypertension: Secondary | ICD-10-CM | POA: Diagnosis not present

## 2023-11-27 DIAGNOSIS — Z1389 Encounter for screening for other disorder: Secondary | ICD-10-CM | POA: Diagnosis not present

## 2023-11-27 DIAGNOSIS — F418 Other specified anxiety disorders: Secondary | ICD-10-CM | POA: Diagnosis not present

## 2023-11-27 DIAGNOSIS — Z0131 Encounter for examination of blood pressure with abnormal findings: Secondary | ICD-10-CM | POA: Diagnosis not present

## 2023-11-27 DIAGNOSIS — N028 Recurrent and persistent hematuria with other morphologic changes: Secondary | ICD-10-CM | POA: Diagnosis not present

## 2023-11-27 DIAGNOSIS — H6123 Impacted cerumen, bilateral: Secondary | ICD-10-CM | POA: Diagnosis not present

## 2023-11-27 NOTE — Telephone Encounter (Signed)
 Patient would like to discuss the results from the ultrasound she had done on 10/15/2023. I informed her that you were not in the office until Monday and you would reach out to her sometime after that. She verbalized understanding.

## 2023-12-01 ENCOUNTER — Encounter: Payer: Self-pay | Admitting: Obstetrics & Gynecology

## 2023-12-01 ENCOUNTER — Other Ambulatory Visit: Payer: Self-pay | Admitting: Obstetrics & Gynecology

## 2023-12-01 DIAGNOSIS — Z1389 Encounter for screening for other disorder: Secondary | ICD-10-CM | POA: Diagnosis not present

## 2023-12-01 DIAGNOSIS — N83202 Unspecified ovarian cyst, left side: Secondary | ICD-10-CM

## 2023-12-01 DIAGNOSIS — Z0131 Encounter for examination of blood pressure with abnormal findings: Secondary | ICD-10-CM | POA: Diagnosis not present

## 2023-12-01 DIAGNOSIS — H6123 Impacted cerumen, bilateral: Secondary | ICD-10-CM | POA: Diagnosis not present

## 2023-12-01 DIAGNOSIS — I10 Essential (primary) hypertension: Secondary | ICD-10-CM | POA: Diagnosis not present

## 2023-12-01 NOTE — Progress Notes (Signed)
 MRI ordered for follow up of left ovarian cyst.

## 2023-12-02 ENCOUNTER — Encounter

## 2023-12-05 ENCOUNTER — Emergency Department
Admission: EM | Admit: 2023-12-05 | Discharge: 2023-12-05 | Disposition: A | Discharge status: Home / Self Care | Attending: Emergency Medicine | Admitting: Emergency Medicine

## 2023-12-05 ENCOUNTER — Emergency Department

## 2023-12-05 ENCOUNTER — Other Ambulatory Visit: Payer: Self-pay

## 2023-12-05 ENCOUNTER — Encounter: Payer: Self-pay | Admitting: Intensive Care

## 2023-12-05 DIAGNOSIS — J45909 Unspecified asthma, uncomplicated: Secondary | ICD-10-CM | POA: Diagnosis not present

## 2023-12-05 DIAGNOSIS — R1084 Generalized abdominal pain: Secondary | ICD-10-CM | POA: Insufficient documentation

## 2023-12-05 DIAGNOSIS — I10 Essential (primary) hypertension: Secondary | ICD-10-CM | POA: Diagnosis not present

## 2023-12-05 DIAGNOSIS — R58 Hemorrhage, not elsewhere classified: Secondary | ICD-10-CM | POA: Diagnosis not present

## 2023-12-05 LAB — COMPREHENSIVE METABOLIC PANEL WITH GFR
ALT: 12 U/L (ref 0–44)
AST: 20 U/L (ref 15–41)
Albumin: 3.6 g/dL (ref 3.5–5.0)
Alkaline Phosphatase: 78 U/L (ref 38–126)
Anion gap: 9 (ref 5–15)
BUN: 22 mg/dL — ABNORMAL HIGH (ref 6–20)
CO2: 21 mmol/L — ABNORMAL LOW (ref 22–32)
Calcium: 8.9 mg/dL (ref 8.9–10.3)
Chloride: 109 mmol/L (ref 98–111)
Creatinine, Ser: 2.01 mg/dL — ABNORMAL HIGH (ref 0.44–1.00)
GFR, Estimated: 34 mL/min — ABNORMAL LOW (ref >60)
Glucose, Bld: 96 mg/dL (ref 70–99)
Potassium: 4 mmol/L (ref 3.5–5.1)
Sodium: 139 mmol/L (ref 135–145)
Total Bilirubin: 0.4 mg/dL (ref 0.0–1.2)
Total Protein: 6.5 g/dL (ref 6.5–8.1)

## 2023-12-05 LAB — LIPASE, BLOOD: Lipase: 31 U/L (ref 11–51)

## 2023-12-05 LAB — CBC
HCT: 40.8 % (ref 36.0–46.0)
Hemoglobin: 13.4 g/dL (ref 12.0–15.0)
MCH: 29.1 pg (ref 26.0–34.0)
MCHC: 32.8 g/dL (ref 30.0–36.0)
MCV: 88.7 fL (ref 80.0–100.0)
Platelets: 229 K/uL (ref 150–400)
RBC: 4.6 MIL/uL (ref 3.87–5.11)
RDW: 13.7 % (ref 11.5–15.5)
WBC: 8.7 K/uL (ref 4.0–10.5)
nRBC: 0 % (ref 0.0–0.2)

## 2023-12-05 LAB — URINALYSIS, ROUTINE W REFLEX MICROSCOPIC
Bilirubin Urine: NEGATIVE
Glucose, UA: NEGATIVE mg/dL
Hgb urine dipstick: NEGATIVE
Ketones, ur: NEGATIVE mg/dL
Leukocytes,Ua: NEGATIVE
Nitrite: NEGATIVE
Protein, ur: >=300 mg/dL — AB
Specific Gravity, Urine: 1.009 (ref 1.005–1.030)
pH: 6 (ref 5.0–8.0)

## 2023-12-05 LAB — POC URINE PREG, ED: Preg Test, Ur: NEGATIVE

## 2023-12-05 NOTE — ED Triage Notes (Signed)
 Patient c/o left abdominal pain X4 days ago. Describes as cramping and pain. Reports waking up yesterday with bruise in same area. Denies injury   History cyst on ovaries

## 2023-12-05 NOTE — ED Notes (Signed)
 See triage note  Presents with left sided abd pain which started about 4 days ago Denies any fever   Describes pain as cramping Then noticed a bruise to lower abd w/o trauma

## 2023-12-05 NOTE — ED Provider Notes (Addendum)
 [facility] Provider Note    Event Date/Time   First MD Initiated Contact with Patient 12/05/23 1355     (approximate)   History   Chief Complaint Abdominal Pain   HPI  Carly Taylor is a 28 y.o. female with past medical history of hypertension, asthma, anemia, and IgA nephropathy who presents to the ED complaining of abdominal pain.  Patient reports that she has been dealing with waxing and waning crampy pain across her entire abdomen for the past 2 days.  She did not initially think much of it, but noticed some bruising to her lower abdomen after waking up this morning.  She states that she often feels nauseous and vomits when she wakes up in the morning, but this has been a chronic problem and unchanged.  She then feels better throughout the course of the day, has been eating and drinking normally with no changes in her bowel movements.  She denies any fevers, dysuria, or flank pain.  She denies any recent trauma to her abdomen.     Physical Exam   Triage Vital Signs: ED Triage Vitals  Encounter Vitals Group     BP 12/05/23 1236 (!) 177/106     Girls Systolic BP Percentile --      Girls Diastolic BP Percentile --      Boys Systolic BP Percentile --      Boys Diastolic BP Percentile --      Pulse Rate 12/05/23 1236 89     Resp 12/05/23 1236 16     Temp 12/05/23 1236 97.8 F (36.6 C)     Temp Source 12/05/23 1236 Oral     SpO2 12/05/23 1236 100 %     Weight 12/05/23 1237 200 lb (90.7 kg)     Height 12/05/23 1237 5' 3 (1.6 m)     Head Circumference --      Peak Flow --      Pain Score 12/05/23 1236 0     Pain Loc --      Pain Education --      Exclude from Growth Chart --     Most recent vital signs: Vitals:   12/05/23 1236  BP: (!) 177/106  Pulse: 89  Resp: 16  Temp: 97.8 F (36.6 C)  SpO2: 100%    Constitutional: Alert and oriented. Eyes: Conjunctivae are normal. Head: Atraumatic. Nose: No  congestion/rhinnorhea. Mouth/Throat: Mucous membranes are moist.  Cardiovascular: Normal rate, regular rhythm. Grossly normal heart sounds.  2+ radial pulses bilaterally. Respiratory: Normal respiratory effort.  No retractions. Lungs CTAB. Gastrointestinal: Soft and mildly tender to palpation diffusely. No distention.  Ecchymosis noted just inferior to the umbilicus. Musculoskeletal: No lower extremity tenderness nor edema.  Neurologic:  Normal speech and language. No gross focal neurologic deficits are appreciated.    ED Results / Procedures / Treatments   Labs (all labs ordered are listed, but only abnormal results are displayed) Labs Reviewed  COMPREHENSIVE METABOLIC PANEL WITH GFR - Abnormal; Notable for the following components:      Result Value   CO2 21 (*)    BUN 22 (*)    Creatinine, Ser 2.01 (*)    GFR, Estimated 34 (*)    All other components within normal limits  URINALYSIS, ROUTINE W REFLEX MICROSCOPIC - Abnormal; Notable for the following components:   Color, Urine STRAW (*)    APPearance CLEAR (*)    Protein, ur >=300 (*)    Bacteria, UA RARE (*)  All other components within normal limits  LIPASE, BLOOD  CBC  POC URINE PREG, ED    PROCEDURES:  Critical Care performed: No  Procedures   MEDICATIONS ORDERED IN ED: Medications - No data to display   IMPRESSION / MDM / ASSESSMENT AND PLAN / ED COURSE  I reviewed the triage vital signs and the nursing notes.                              28 y.o. female with past medical history of hypertension, asthma, anemia, and IgA nephropathy who presents to the ED complaining of waxing and waning crampy pain in her abdomen for the past 2 days with ecchymosis noted today.  Patient's presentation is most consistent with acute presentation with potential threat to life or bodily function.  Differential diagnosis includes, but is not limited to, appendicitis, diverticulitis, ovarian cyst, UTI, kidney stone,  pancreatitis, hepatitis, cholecystitis, biliary colic.  Patient nontoxic-appearing and in no acute distress, vital signs remarkable for hypertension but otherwise reassuring.  Patient's abdomen is soft but she does have some diffuse tenderness as well as ecchymosis just below her umbilicus.  Labs show mild AKI without significant anemia, leukocytosis, or electrolyte abnormality.  LFTs and lipase are unremarkable.  We will further assess with CT imaging, given worsening renal function we will hold off on IV contrast.  Patient declines pain or nausea medication currently, pregnancy testing is negative and urinalysis does not appear concerning for infection.  Patient does state that she was recently restarted on medication for her blood pressure, no evidence of hypertensive emergency at this time.  Patient now stating that her pain has resolved and she would like to hold off on CT imaging as she needs to leave to pick up her children from daycare.  She was counseled to return to the ED for worsening pain or ecchymosis to her abdomen, also counseled to return for any other new or worsening symptoms.  Patient advised to follow-up with her PCP for recheck of renal function and to take blood pressure medication as prescribed.  Patient agrees with plan.      FINAL CLINICAL IMPRESSION(S) / ED DIAGNOSES   Final diagnoses:  Generalized abdominal pain  Ecchymosis     Rx / DC Orders   ED Discharge Orders     None        Note:  This document was prepared using Dragon voice recognition software and may include unintentional dictation errors.   Willo Dunnings, MD 12/05/23 8391    Willo Dunnings, MD 12/05/23 [phone]

## 2023-12-08 ENCOUNTER — Ambulatory Visit: Admitting: Dermatology

## 2023-12-08 ENCOUNTER — Encounter: Payer: Self-pay | Admitting: Dermatology

## 2023-12-08 DIAGNOSIS — L409 Psoriasis, unspecified: Secondary | ICD-10-CM

## 2023-12-08 MED ORDER — CLOBETASOL PROPIONATE 0.05 % EX CREA: TOPICAL_CREAM | CUTANEOUS | 2 refills | Status: AC

## 2023-12-08 NOTE — Patient Instructions (Signed)
 Start Clobetasol  0.05% cream twice a day to affected areas for psoriasis. Avoid applying to face, groin, and axilla. Use as directed. Long-term use can cause thinning of the skin.  Continue Fluocinolone scalp oil as directed by PCP.   Topical steroids (such as triamcinolone, fluocinolone, fluocinonide, mometasone, clobetasol , halobetasol, betamethasone , hydrocortisone) can cause thinning and lightening of the skin if they are used for too long in the same area. Your physician has selected the right strength medicine for your problem and area affected on the body. Please use your medication only as directed by your physician to prevent side effects.     Gentle Skin Care Guide  1. Bathe no more than once a day.  2. Avoid bathing in hot water  3. Use a mild soap like Dove, Vanicream, Cetaphil, CeraVe. Can use Lever 2000 or Cetaphil antibacterial soap  4. Use soap only where you need it. On most days, use it under your arms, between your legs, and on your feet. Let the water rinse other areas unless visibly dirty.  5. When you get out of the bath/shower, use a towel to gently blot your skin dry, don't rub it.  6. While your skin is still a little damp, apply a moisturizing cream such as Vanicream, CeraVe, Cetaphil, Eucerin, Sarna lotion or plain Vaseline Jelly. For hands apply Neutrogena [place] Hand Cream or Excipial Hand Cream.  7. Reapply moisturizer any time you start to itch or feel dry.  8. Sometimes using free and clear laundry detergents can be helpful. Fabric softener sheets should be avoided. Downy Free & Gentle liquid, or any liquid fabric softener that is free of dyes and perfumes, it acceptable to use  9. If your doctor has given you prescription creams you may apply moisturizers over them       Due to recent changes in healthcare laws, you may see results of your pathology and/or laboratory studies on MyChart before the doctors have had a chance to review them. We  understand that in some cases there may be results that are confusing or concerning to you. Please understand that not all results are received at the same time and often the doctors may need to interpret multiple results in order to provide you with the best plan of care or course of treatment. Therefore, we ask that you please give us  2 business days to thoroughly review all your results before contacting the office for clarification. Should we see a critical lab result, you will be contacted sooner.   If You Need Anything After Your Visit  If you have any questions or concerns for your doctor, please call our main line at [phone] and press option 4 to reach your doctor's medical assistant. If no one answers, please leave a voicemail as directed and we will return your call as soon as possible. Messages left after 4 pm will be answered the following business day.   You may also send us  a message via MyChart. We typically respond to MyChart messages within 1-2 business days.  For prescription refills, please ask your pharmacy to contact our office. Our fax number is [phone].  If you have an urgent issue when the clinic is closed that cannot wait until the next business day, you can page your doctor at the number below.    Please note that while we do our best to be available for urgent issues outside of office hours, we are not available 24/7.   If you have an urgent issue  and are unable to reach us , you may choose to seek medical care at your doctor's office, retail clinic, urgent care center, or emergency room.  If you have a medical emergency, please immediately call 911 or go to the emergency department.  Pager Numbers  - Dr. Hester: [phone]  - Dr. Jackquline: [phone]  - Dr. Claudene: [phone]   In the event of inclement weather, please call our main line at [phone] for an update on the status of any delays or closures.  Dermatology Medication Tips: Please  keep the boxes that topical medications come in in order to help keep track of the instructions about where and how to use these. Pharmacies typically print the medication instructions only on the boxes and not directly on the medication tubes.   If your medication is too expensive, please contact our office at [phone] option 4 or send us  a message through MyChart.   We are unable to tell what your co-pay for medications will be in advance as this is different depending on your insurance coverage. However, we may be able to find a substitute medication at lower cost or fill out paperwork to get insurance to cover a needed medication.   If a prior authorization is required to get your medication covered by your insurance company, please allow us  1-2 business days to complete this process.  Drug prices often vary depending on where the prescription is filled and some pharmacies may offer cheaper prices.  The website www.goodrx.com contains coupons for medications through different pharmacies. The prices here do not account for what the cost may be with help from insurance (it may be cheaper with your insurance), but the website can give you the price if you did not use any insurance.  - You can print the associated coupon and take it with your prescription to the pharmacy.  - You may also stop by our office during regular business hours and pick up a GoodRx coupon card.  - If you need your prescription sent electronically to a different pharmacy, notify our office through [facility] or by phone at [phone] option 4.     Si Usted Necesita Algo Despus de Su Visita  Tambin puede enviarnos un mensaje a travs de Clinical cytogeneticist. Por lo general respondemos a los mensajes de MyChart en el transcurso de 1 a 2 das hbiles.  Para renovar recetas, por favor pida a su farmacia que se ponga en contacto con nuestra oficina. Randi lakes de fax es [place] [phone].  Si tiene un asunto urgente  cuando la clnica est cerrada y que no puede esperar hasta el siguiente da hbil, puede llamar/localizar a su doctor(a) al nmero que aparece a continuacin.   Por favor, tenga en cuenta que aunque hacemos todo lo posible para estar disponibles para asuntos urgentes fuera del horario de [place], no estamos disponibles las 24 horas del da, los 7 [address]  [address] de la [place].   Si tiene un problema urgente y no puede comunicarse con nosotros, puede optar por buscar atencin mdica  en el consultorio de su doctor(a), en una clnica privada, en un centro de atencin urgente o en una sala de emergencias.  Si tiene Engineer, drilling, por favor llame inmediatamente al 911 o vaya a la sala de emergencias.  Nmeros de bper  - Dr. Hester: [phone]  - Dra. Jackquline: [phone]  - Dr. Claudene: [phone]   En caso de inclemencias del tiempo, por favor llame a nuestra lnea principal al [phone] para ignacia  actualizacin sobre el estado de cualquier retraso o cierre.  Consejos para la medicacin en dermatologa: Por favor, guarde las cajas en las que vienen los medicamentos de uso tpico para ayudarle a seguir las instrucciones sobre dnde y cmo usarlos. Las farmacias generalmente imprimen las instrucciones del medicamento slo en las cajas y no directamente en los tubos del [place].   Si su medicamento es muy caro, por favor, pngase en contacto con landry rieger llamando al [phone] y presione la opcin 4 o envenos un mensaje a travs de Clinical cytogeneticist.   No podemos decirle cul ser su copago por los medicamentos por adelantado ya que esto es diferente dependiendo de la cobertura de su seguro. Sin embargo, es posible que podamos encontrar un medicamento sustituto a Audiological scientist un formulario para que el seguro cubra el medicamento que se considera necesario.   Si se requiere una autorizacin previa para que su compaa de seguros malta su medicamento, por favor permtanos de 1 a 2  das hbiles para completar este proceso.  Los precios de los medicamentos varan con frecuencia dependiendo del Environmental consultant de dnde se surte la receta y alguna farmacias pueden ofrecer precios ms baratos.  El sitio web www.goodrx.com tiene cupones para medicamentos de Health and safety inspector. Los precios aqu no tienen en cuenta lo que podra costar con la ayuda del seguro (puede ser ms barato con su seguro), pero el sitio web puede darle el precio si no utiliz Tourist information centre manager.  - Puede imprimir el cupn correspondiente y llevarlo con su receta a la farmacia.  - Tambin puede pasar por nuestra oficina durante el horario de atencin regular y Education officer, museum una tarjeta de cupones de GoodRx.  - Si necesita que su receta se enve electrnicamente a una farmacia diferente, informe a nuestra oficina a travs de MyChart de [place] o por telfono llamando al [phone] y presione la opcin 4.

## 2023-12-08 NOTE — Progress Notes (Signed)
   New Patient Visit   Subjective  Carly Taylor is a 28 y.o. female who presents for the following: Psoriasis. Few years. Started on feet, hands, legs, face, scalp. Now on scalp, elbows, occasionally face. Worse in winter. Started Fluocinolone oil on scalp recently, has used only once. Has not started Clobetasol , insurance would not cover, she will p/u today.    The following portions of the chart were reviewed this encounter and updated as appropriate: medications, allergies, medical history  Review of Systems:  No other skin or systemic complaints except as noted in HPI or Assessment and Plan.  Objective  Well appearing patient in no apparent distress; mood and affect are within normal limits.  Areas Examined: Scalp, elbows, hands, back  Relevant exam findings are noted in the Assessment and Plan.                 Assessment & Plan   PSORIASIS   Related Medications clobetasol  cream (TEMOVATE ) 0.05 % Apply twice daily to affected body areas for psoriasis. Avoid applying to face, groin, and axilla.  PSORIASIS, includes special site (scalp) Well-demarcated erythematous papules with silvery scale, at B/L elbows, frontal scalp, R MCPs 2 and 3. 3% BSA.  Chronic and persistent condition with duration or expected duration over one year. Condition is symptomatic/ bothersome to patient. Not currently at goal.   Patient C/O  joint pain at knees  Treatment Plan: Start Clobetasol  0.05% cream twice a day to affected areas for psoriasis. Avoid applying to face, groin, and axilla. Use as directed. Long-term use can cause thinning of the skin.  Continue Fluocinolone scalp oil as directed by PCP.   Topical steroids (such as triamcinolone, fluocinolone, fluocinonide, mometasone, clobetasol , halobetasol, betamethasone , hydrocortisone) can cause thinning and lightening of the skin if they are used for too long in the same area. Your physician has selected the right strength  medicine for your problem and area affected on the body. Please use your medication only as directed by your physician to prevent side effects.   If becomes more widespread on body consider biologic treatment.   Counseling on psoriasis and coordination of care  psoriasis is a chronic non-curable, but treatable genetic/hereditary disease that may have other systemic features affecting other organ systems such as joints (Psoriatic Arthritis). It is associated with an increased risk of inflammatory bowel disease, heart disease, non-alcoholic fatty liver disease, and depression.  Treatments include light and laser treatments; topical medications; and systemic medications including oral and injectables.    Return if symptoms worsen or fail to improve.  I, Jill Parcell, CMA, am acting as scribe for Boneta Sharps, MD.   Documentation: I have reviewed the above documentation for accuracy and completeness, and I agree with the above.  Boneta Sharps, MD

## 2023-12-09 ENCOUNTER — Encounter

## 2023-12-11 DIAGNOSIS — Z0131 Encounter for examination of blood pressure with abnormal findings: Secondary | ICD-10-CM | POA: Diagnosis not present

## 2023-12-11 DIAGNOSIS — N028 Recurrent and persistent hematuria with other morphologic changes: Secondary | ICD-10-CM | POA: Diagnosis not present

## 2023-12-11 DIAGNOSIS — F418 Other specified anxiety disorders: Secondary | ICD-10-CM | POA: Diagnosis not present

## 2023-12-11 DIAGNOSIS — Z1389 Encounter for screening for other disorder: Secondary | ICD-10-CM | POA: Diagnosis not present

## 2023-12-11 DIAGNOSIS — L409 Psoriasis, unspecified: Secondary | ICD-10-CM | POA: Diagnosis not present

## 2023-12-11 DIAGNOSIS — I10 Essential (primary) hypertension: Secondary | ICD-10-CM | POA: Diagnosis not present

## 2023-12-16 ENCOUNTER — Encounter

## 2023-12-16 ENCOUNTER — Other Ambulatory Visit

## 2023-12-16 DIAGNOSIS — Z419 Encounter for procedure for purposes other than remedying health state, unspecified: Secondary | ICD-10-CM | POA: Diagnosis not present

## 2023-12-23 ENCOUNTER — Encounter

## 2023-12-24 ENCOUNTER — Ambulatory Visit
Admission: RE | Admit: 2023-12-24 | Discharge: 2023-12-24 | Disposition: A | Discharge status: Home / Self Care | Source: Ambulatory Visit | Attending: Obstetrics & Gynecology | Admitting: Obstetrics & Gynecology

## 2023-12-24 DIAGNOSIS — N83202 Unspecified ovarian cyst, left side: Secondary | ICD-10-CM | POA: Insufficient documentation

## 2023-12-24 DIAGNOSIS — N83209 Unspecified ovarian cyst, unspecified side: Secondary | ICD-10-CM | POA: Diagnosis not present

## 2023-12-24 MED ORDER — GADOBUTROL 1 MMOL/ML IV SOLN
9.0000 mL | Freq: Once | INTRAVENOUS | Status: AC | PRN
  Administered 2023-12-24: 9 mL via INTRAVENOUS

## 2023-12-27 ENCOUNTER — Other Ambulatory Visit: Payer: Self-pay | Admitting: Medical Genetics

## 2024-01-08 ENCOUNTER — Telehealth: Payer: Self-pay

## 2024-01-08 NOTE — Telephone Encounter (Signed)
 Pt calling triage requesting a call from Dr Starla to discuss the MRI results. Pt aware Starla will return her call when she's in the office, next week. Mikhala Kenan cma

## 2024-01-14 ENCOUNTER — Telehealth: Payer: Self-pay | Admitting: Obstetrics & Gynecology

## 2024-01-14 NOTE — Telephone Encounter (Signed)
 We spoke about MRI results, endometriomas. I would rec OCPs except that she has poorly controled HTN. I am willing to discuss other medical treatments at a future visit. She tells me that she is willing to consider definitive treatment in the form of hysterectomy, BSO. She is now aware that this office, [facility], does not have a surgeon to do major cases. If she is interested in surgical treatment then she would need to go to a different office. She previously saw Dr. Estelle in [facility] and will call that office.

## 2024-01-15 DIAGNOSIS — R809 Proteinuria, unspecified: Secondary | ICD-10-CM | POA: Diagnosis not present

## 2024-01-15 DIAGNOSIS — I10 Essential (primary) hypertension: Secondary | ICD-10-CM | POA: Diagnosis not present

## 2024-01-15 DIAGNOSIS — N02B9 Other recurrent and persistent immunoglobulin A nephropathy: Secondary | ICD-10-CM | POA: Diagnosis not present

## 2024-01-15 DIAGNOSIS — N1832 Chronic kidney disease, stage 3b: Secondary | ICD-10-CM | POA: Diagnosis not present

## 2024-01-15 DIAGNOSIS — N2889 Other specified disorders of kidney and ureter: Secondary | ICD-10-CM | POA: Diagnosis not present

## 2024-01-16 DIAGNOSIS — Z419 Encounter for procedure for purposes other than remedying health state, unspecified: Secondary | ICD-10-CM | POA: Diagnosis not present

## 2024-02-20 ENCOUNTER — Other Ambulatory Visit: Payer: Self-pay | Admitting: Medical Genetics

## 2024-02-20 DIAGNOSIS — I129 Hypertensive chronic kidney disease with stage 1 through stage 4 chronic kidney disease, or unspecified chronic kidney disease: Secondary | ICD-10-CM | POA: Diagnosis not present

## 2024-02-20 DIAGNOSIS — Z006 Encounter for examination for normal comparison and control in clinical research program: Secondary | ICD-10-CM

## 2024-02-20 DIAGNOSIS — Z9049 Acquired absence of other specified parts of digestive tract: Secondary | ICD-10-CM | POA: Diagnosis not present

## 2024-02-20 DIAGNOSIS — N289 Disorder of kidney and ureter, unspecified: Secondary | ICD-10-CM | POA: Diagnosis not present

## 2024-02-20 DIAGNOSIS — R809 Proteinuria, unspecified: Secondary | ICD-10-CM | POA: Diagnosis not present

## 2024-02-20 DIAGNOSIS — N99840 Postprocedural hematoma of a genitourinary system organ or structure following a genitourinary system procedure: Secondary | ICD-10-CM | POA: Diagnosis not present

## 2024-02-20 DIAGNOSIS — Z886 Allergy status to analgesic agent status: Secondary | ICD-10-CM | POA: Diagnosis not present

## 2024-02-20 DIAGNOSIS — R7989 Other specified abnormal findings of blood chemistry: Secondary | ICD-10-CM | POA: Diagnosis not present

## 2024-02-20 DIAGNOSIS — Z79899 Other long term (current) drug therapy: Secondary | ICD-10-CM | POA: Diagnosis not present

## 2024-02-20 DIAGNOSIS — L7632 Postprocedural hematoma of skin and subcutaneous tissue following other procedure: Secondary | ICD-10-CM | POA: Diagnosis not present

## 2024-02-20 DIAGNOSIS — N189 Chronic kidney disease, unspecified: Secondary | ICD-10-CM | POA: Diagnosis not present

## 2024-02-23 DIAGNOSIS — I10 Essential (primary) hypertension: Secondary | ICD-10-CM | POA: Diagnosis not present

## 2024-02-23 DIAGNOSIS — N1832 Chronic kidney disease, stage 3b: Secondary | ICD-10-CM | POA: Diagnosis not present

## 2024-02-23 DIAGNOSIS — R809 Proteinuria, unspecified: Secondary | ICD-10-CM | POA: Diagnosis not present

## 2024-03-01 DIAGNOSIS — N02B9 Other recurrent and persistent immunoglobulin A nephropathy: Secondary | ICD-10-CM | POA: Diagnosis not present

## 2024-03-01 DIAGNOSIS — N1832 Chronic kidney disease, stage 3b: Secondary | ICD-10-CM | POA: Diagnosis not present

## 2024-03-01 DIAGNOSIS — I10 Essential (primary) hypertension: Secondary | ICD-10-CM | POA: Diagnosis not present

## 2024-03-01 DIAGNOSIS — E785 Hyperlipidemia, unspecified: Secondary | ICD-10-CM | POA: Diagnosis not present

## 2024-03-01 DIAGNOSIS — R801 Persistent proteinuria, unspecified: Secondary | ICD-10-CM | POA: Diagnosis not present

## 2024-03-01 DIAGNOSIS — N2889 Other specified disorders of kidney and ureter: Secondary | ICD-10-CM | POA: Diagnosis not present

## 2024-03-16 DIAGNOSIS — Z1151 Encounter for screening for human papillomavirus (HPV): Secondary | ICD-10-CM | POA: Diagnosis not present

## 2024-03-16 DIAGNOSIS — I10 Essential (primary) hypertension: Secondary | ICD-10-CM | POA: Diagnosis not present

## 2024-03-16 DIAGNOSIS — N2889 Other specified disorders of kidney and ureter: Secondary | ICD-10-CM | POA: Diagnosis not present

## 2024-03-16 DIAGNOSIS — Z1272 Encounter for screening for malignant neoplasm of vagina: Secondary | ICD-10-CM | POA: Diagnosis not present

## 2024-03-16 DIAGNOSIS — Z124 Encounter for screening for malignant neoplasm of cervix: Secondary | ICD-10-CM | POA: Diagnosis not present

## 2024-03-16 DIAGNOSIS — Z0142 Encounter for cervical smear to confirm findings of recent normal smear following initial abnormal smear: Secondary | ICD-10-CM | POA: Diagnosis not present

## 2024-03-16 DIAGNOSIS — Z113 Encounter for screening for infections with a predominantly sexual mode of transmission: Secondary | ICD-10-CM | POA: Diagnosis not present
